# Patient Record
Sex: Female | Born: 1955
Health system: Southern US, Community
[De-identification: ages and names within clinical notes are randomized; demographics above are authoritative.]

## PROBLEM LIST (undated history)

## (undated) DIAGNOSIS — E785 Hyperlipidemia, unspecified: Secondary | ICD-10-CM

## (undated) DIAGNOSIS — F419 Anxiety disorder, unspecified: Secondary | ICD-10-CM

## (undated) DIAGNOSIS — J302 Other seasonal allergic rhinitis: Secondary | ICD-10-CM

## (undated) HISTORY — DX: Other seasonal allergic rhinitis: J30.2

## (undated) HISTORY — DX: Anxiety disorder, unspecified: F41.9

## (undated) HISTORY — DX: Hyperlipidemia, unspecified: E78.5

---

## 1998-11-23 ENCOUNTER — Ambulatory Visit (HOSPITAL_COMMUNITY): Admission: RE | Admit: 1998-11-23 | Discharge: 1998-11-23 | Payer: Self-pay | Admitting: Obstetrics and Gynecology

## 1998-11-23 ENCOUNTER — Encounter: Payer: Self-pay | Admitting: Obstetrics and Gynecology

## 2000-09-26 ENCOUNTER — Encounter: Payer: Self-pay | Admitting: Gynecology

## 2000-09-26 ENCOUNTER — Ambulatory Visit (HOSPITAL_COMMUNITY): Admission: RE | Admit: 2000-09-26 | Discharge: 2000-09-26 | Payer: Self-pay | Admitting: Gynecology

## 2001-10-15 ENCOUNTER — Encounter: Payer: Self-pay | Admitting: Gynecology

## 2001-10-15 ENCOUNTER — Ambulatory Visit (HOSPITAL_COMMUNITY): Admission: RE | Admit: 2001-10-15 | Discharge: 2001-10-15 | Payer: Self-pay | Admitting: Gynecology

## 2003-06-22 ENCOUNTER — Other Ambulatory Visit: Admission: RE | Admit: 2003-06-22 | Discharge: 2003-06-22 | Payer: Self-pay | Admitting: Gynecology

## 2003-07-03 ENCOUNTER — Ambulatory Visit (HOSPITAL_COMMUNITY): Admission: RE | Admit: 2003-07-03 | Discharge: 2003-07-03 | Payer: Self-pay | Admitting: Gynecology

## 2004-06-27 ENCOUNTER — Other Ambulatory Visit: Admission: RE | Admit: 2004-06-27 | Discharge: 2004-06-27 | Payer: Self-pay | Admitting: Gynecology

## 2004-08-05 ENCOUNTER — Ambulatory Visit (HOSPITAL_COMMUNITY): Admission: RE | Admit: 2004-08-05 | Discharge: 2004-08-05 | Payer: Self-pay | Admitting: Gynecology

## 2005-07-07 ENCOUNTER — Other Ambulatory Visit: Admission: RE | Admit: 2005-07-07 | Discharge: 2005-07-07 | Payer: Self-pay | Admitting: Gynecology

## 2005-08-07 ENCOUNTER — Ambulatory Visit (HOSPITAL_COMMUNITY): Admission: RE | Admit: 2005-08-07 | Discharge: 2005-08-07 | Payer: Self-pay | Admitting: Gynecology

## 2006-07-30 ENCOUNTER — Other Ambulatory Visit: Admission: RE | Admit: 2006-07-30 | Discharge: 2006-07-30 | Payer: Self-pay | Admitting: Gynecology

## 2006-10-24 ENCOUNTER — Ambulatory Visit (HOSPITAL_COMMUNITY): Admission: RE | Admit: 2006-10-24 | Discharge: 2006-10-24 | Payer: Self-pay | Admitting: Gynecology

## 2007-08-09 ENCOUNTER — Other Ambulatory Visit: Admission: RE | Admit: 2007-08-09 | Discharge: 2007-08-09 | Payer: Self-pay | Admitting: Gynecology

## 2007-10-29 ENCOUNTER — Ambulatory Visit: Payer: Self-pay | Admitting: Women's Health

## 2007-10-29 ENCOUNTER — Ambulatory Visit (HOSPITAL_COMMUNITY): Admission: RE | Admit: 2007-10-29 | Discharge: 2007-10-29 | Payer: Self-pay | Admitting: Gynecology

## 2007-12-17 ENCOUNTER — Ambulatory Visit: Payer: Self-pay | Admitting: Women's Health

## 2008-09-07 ENCOUNTER — Encounter: Payer: Self-pay | Admitting: Women's Health

## 2008-09-07 ENCOUNTER — Ambulatory Visit: Payer: Self-pay | Admitting: Women's Health

## 2008-09-07 ENCOUNTER — Other Ambulatory Visit: Admission: RE | Admit: 2008-09-07 | Discharge: 2008-09-07 | Payer: Self-pay | Admitting: Gynecology

## 2008-10-29 ENCOUNTER — Ambulatory Visit (HOSPITAL_COMMUNITY): Admission: RE | Admit: 2008-10-29 | Discharge: 2008-10-29 | Payer: Self-pay | Admitting: Gynecology

## 2009-09-08 ENCOUNTER — Ambulatory Visit: Payer: Self-pay | Admitting: Women's Health

## 2009-09-08 ENCOUNTER — Other Ambulatory Visit: Admission: RE | Admit: 2009-09-08 | Discharge: 2009-09-08 | Payer: Self-pay | Admitting: Gynecology

## 2009-11-02 ENCOUNTER — Ambulatory Visit (HOSPITAL_COMMUNITY): Admission: RE | Admit: 2009-11-02 | Discharge: 2009-11-02 | Payer: Self-pay | Admitting: Gynecology

## 2010-09-27 ENCOUNTER — Other Ambulatory Visit: Payer: Self-pay | Admitting: Women's Health

## 2010-09-27 DIAGNOSIS — Z1231 Encounter for screening mammogram for malignant neoplasm of breast: Secondary | ICD-10-CM

## 2010-10-18 DIAGNOSIS — K635 Polyp of colon: Secondary | ICD-10-CM | POA: Insufficient documentation

## 2010-10-18 DIAGNOSIS — F419 Anxiety disorder, unspecified: Secondary | ICD-10-CM | POA: Insufficient documentation

## 2010-10-18 DIAGNOSIS — J302 Other seasonal allergic rhinitis: Secondary | ICD-10-CM | POA: Insufficient documentation

## 2010-10-20 ENCOUNTER — Encounter: Payer: Self-pay | Admitting: Women's Health

## 2010-11-07 ENCOUNTER — Other Ambulatory Visit (HOSPITAL_COMMUNITY)
Admission: RE | Admit: 2010-11-07 | Discharge: 2010-11-07 | Disposition: A | Discharge status: Home / Self Care | Payer: PRIVATE HEALTH INSURANCE | Source: Ambulatory Visit | Attending: Women's Health | Admitting: Women's Health

## 2010-11-07 ENCOUNTER — Encounter: Payer: Self-pay | Admitting: Women's Health

## 2010-11-07 ENCOUNTER — Ambulatory Visit (INDEPENDENT_AMBULATORY_CARE_PROVIDER_SITE_OTHER): Payer: PRIVATE HEALTH INSURANCE | Admitting: Women's Health

## 2010-11-07 ENCOUNTER — Ambulatory Visit (HOSPITAL_COMMUNITY)
Admission: RE | Admit: 2010-11-07 | Discharge: 2010-11-07 | Disposition: A | Discharge status: Home / Self Care | Payer: PRIVATE HEALTH INSURANCE | Source: Ambulatory Visit | Attending: Women's Health | Admitting: Women's Health

## 2010-11-07 VITALS — BP 120/70 | Ht 63.0 in | Wt 188.0 lb

## 2010-11-07 DIAGNOSIS — Z01419 Encounter for gynecological examination (general) (routine) without abnormal findings: Secondary | ICD-10-CM | POA: Insufficient documentation

## 2010-11-07 DIAGNOSIS — R82998 Other abnormal findings in urine: Secondary | ICD-10-CM

## 2010-11-07 DIAGNOSIS — Z1231 Encounter for screening mammogram for malignant neoplasm of breast: Secondary | ICD-10-CM | POA: Insufficient documentation

## 2010-11-07 DIAGNOSIS — Z13220 Encounter for screening for lipoid disorders: Secondary | ICD-10-CM

## 2010-11-07 DIAGNOSIS — Z833 Family history of diabetes mellitus: Secondary | ICD-10-CM

## 2010-11-07 NOTE — Progress Notes (Signed)
Veronica Leonard Sep 30, 1955 [id number]    History:    The patient presents for annual exam.  Work and home life fine, Harrold Donath is now 32. Reviewed Gardasil for him, daughters have gotten.   Past medical history, past surgical history, family history and social history were all reviewed and documented in the EPIC chart.   ROS:  A  ROS was performed and pertinent positives and negatives are included in the history.  Exam:  Filed Vitals:   11/07/10 1017  BP: 120/70    General appearance:  Normal Head/Neck:  Normal, without cervical or supraclavicular adenopathy. Thyroid:  Symmetrical, normal in size, without palpable masses or nodularity. Respiratory  Effort:  Normal  Auscultation:  Clear without wheezing or rhonchi Cardiovascular  Auscultation:  Regular rate, without rubs, murmurs or gallops  Edema/varicosities:  Not grossly evident Abdominal  Soft,nontender, without masses, guarding or rebound.  Liver/spleen:  No organomegaly noted  Hernia:  None appreciated  Skin  Inspection:  Grossly normal  Palpation:  Grossly normal Neurologic/psychiatric  Orientation:  Normal with appropriate conversation.  Mood/affect:  Normal  Genitourinary    Breasts: Examined lying and sitting.     Right: Without masses, retractions, discharge or axillary adenopathy.     Left: Without masses, retractions, discharge or axillary adenopathy.   Inguinal/mons:  Normal without inguinal adenopathy  External genitalia:  Normal  BUS/Urethra/Skene's glands:  Normal  Bladder:  Normal  Vagina:  Normal  Cervix:  Normal  Uterus:  retroverted, normal in size, shape and contour.  Midline and mobile  Adnexa/parametria:     Rt: Without masses or tenderness.   Lt: Without masses or tenderness.  Anus and perineum: Normal  Digital rectal exam: Normal sphincter tone without palpated masses or tenderness  Assessment/Plan:  55 y.o. DWF G3P3 for annual exam monthly 6 day cycle/not sexually active. No complaints.  Menopause reviewed, and asymptomatic. Colonoscopy in 2009, negative polyp. Repeat in 2 years. Had a mammogram today.  Normal GYN exam  Plan: SBEs, continue annual mammogram, encouraged to increase exercise, decrease calories for weight loss. Reviewed to return to the office if no cycle for greater than 3 months,. Condoms encouraged if becomes sexually active. Vitamin D 2000 daily encouraged. CBC, glucose, lipid profile, UA and Pap.    Harrington Challenger [facility], 10:45 AM 11/07/2010

## 2010-11-08 ENCOUNTER — Other Ambulatory Visit: Payer: Self-pay | Admitting: Gynecology

## 2010-11-08 DIAGNOSIS — N390 Urinary tract infection, site not specified: Secondary | ICD-10-CM

## 2010-11-08 MED ORDER — SULFAMETHOXAZOLE-TRIMETHOPRIM 800-160 MG PO TABS: 1.0000 | ORAL_TABLET | Freq: Two times a day (BID) | ORAL | Status: AC

## 2010-11-08 NOTE — Progress Notes (Signed)
Pt informed with the below note. 

## 2010-11-08 NOTE — Progress Notes (Signed)
Urine culture positive for UTI we'll treat with Septra DS 1 by mouth twice a day x3 days

## 2010-11-28 ENCOUNTER — Ambulatory Visit (INDEPENDENT_AMBULATORY_CARE_PROVIDER_SITE_OTHER): Payer: PRIVATE HEALTH INSURANCE | Admitting: Women's Health

## 2010-11-28 DIAGNOSIS — R82998 Other abnormal findings in urine: Secondary | ICD-10-CM

## 2011-04-25 ENCOUNTER — Other Ambulatory Visit: Payer: Self-pay | Admitting: *Deleted

## 2011-04-25 DIAGNOSIS — E7800 Pure hypercholesterolemia, unspecified: Secondary | ICD-10-CM

## 2011-10-06 ENCOUNTER — Other Ambulatory Visit: Payer: Self-pay | Admitting: Women's Health

## 2011-10-06 DIAGNOSIS — Z1231 Encounter for screening mammogram for malignant neoplasm of breast: Secondary | ICD-10-CM

## 2011-11-09 ENCOUNTER — Ambulatory Visit (INDEPENDENT_AMBULATORY_CARE_PROVIDER_SITE_OTHER): Payer: PRIVATE HEALTH INSURANCE | Admitting: Women's Health

## 2011-11-09 ENCOUNTER — Encounter: Payer: Self-pay | Admitting: Women's Health

## 2011-11-09 ENCOUNTER — Ambulatory Visit (HOSPITAL_COMMUNITY)
Admission: RE | Admit: 2011-11-09 | Discharge: 2011-11-09 | Disposition: A | Discharge status: Home / Self Care | Payer: PRIVATE HEALTH INSURANCE | Source: Ambulatory Visit | Attending: Women's Health | Admitting: Women's Health

## 2011-11-09 VITALS — BP 134/88 | Ht 62.25 in | Wt 192.0 lb

## 2011-11-09 DIAGNOSIS — Z833 Family history of diabetes mellitus: Secondary | ICD-10-CM

## 2011-11-09 DIAGNOSIS — N912 Amenorrhea, unspecified: Secondary | ICD-10-CM

## 2011-11-09 DIAGNOSIS — Z13220 Encounter for screening for lipoid disorders: Secondary | ICD-10-CM

## 2011-11-09 DIAGNOSIS — K635 Polyp of colon: Secondary | ICD-10-CM

## 2011-11-09 DIAGNOSIS — D126 Benign neoplasm of colon, unspecified: Secondary | ICD-10-CM

## 2011-11-09 DIAGNOSIS — Z01419 Encounter for gynecological examination (general) (routine) without abnormal findings: Secondary | ICD-10-CM

## 2011-11-09 DIAGNOSIS — Z1231 Encounter for screening mammogram for malignant neoplasm of breast: Secondary | ICD-10-CM

## 2011-11-09 LAB — LIPID PANEL
Cholesterol: 268 mg/dL — ABNORMAL HIGH (ref 0–200)
LDL Cholesterol: 185 mg/dL — ABNORMAL HIGH (ref 0–99)
VLDL: 36 mg/dL (ref 0–40)

## 2011-11-09 LAB — GLUCOSE, RANDOM: Glucose, Bld: 91 mg/dL (ref 70–99)

## 2011-11-09 NOTE — Progress Notes (Signed)
Veronica Leonard [birth year] [id number]    History:    The patient presents for annual exam.  Had a regular monthly 5-6 day cycle until June. Colonoscopy 2009 negative polyp. History of normal Paps and mammograms. Partner history of prostate cancer limited sexual activity.   Past medical history, past surgical history, family history and social history were all reviewed and documented in the EPIC chart. Works for [employer]. Son Veronica Leonard 18, Veronica Leonard works at [employer] 36, has 4 sons. Father and brother diabetics, sister with hypertension.   ROS:  A  ROS was performed and pertinent positives and negatives are included in the history.  Exam:  Filed Vitals:   11/09/11 0815  BP: 134/88    General appearance:  Normal Head/Neck:  Normal, without cervical or supraclavicular adenopathy. Thyroid:  Symmetrical, normal in size, without palpable masses or nodularity. Respiratory  Effort:  Normal  Auscultation:  Clear without wheezing or rhonchi Cardiovascular  Auscultation:  Regular rate, without rubs, murmurs or gallops  Edema/varicosities:  Not grossly evident Abdominal  Soft,nontender, without masses, guarding or rebound.  Liver/spleen:  No organomegaly noted  Hernia:  None appreciated  Skin  Inspection:  Grossly normal  Palpation:  Grossly normal Neurologic/psychiatric  Orientation:  Normal with appropriate conversation.  Mood/affect:  Normal  Genitourinary    Breasts: Examined lying and sitting.     Right: Without masses, retractions, discharge or axillary adenopathy.     Left: Without masses, retractions, discharge or axillary adenopathy.   Inguinal/mons:  Normal without inguinal adenopathy  External genitalia:  Normal  BUS/Urethra/Skene's glands:  Normal  Bladder:  Normal  Vagina:  Normal  Cervix:  Normal  Uterus:   normal in size, shape and contour.  Midline and mobile  Adnexa/parametria:     Rt: Without masses or tenderness.   Lt: Without masses or tenderness.  Anus  and perineum: Normal  Digital rectal exam: Normal sphincter tone without palpated masses or tenderness  Assessment/Plan:  56 y.o. D. WF G3 P3 for annual exam with no complaints.  Amenorrheic x4 months Obesity Blood pressure 134/88/new onset. History of increased lipid panel on no medication  Plan: Instructed to recheck blood pressure away from office if continues greater than 130/80 instructed to followup with primary care/Dr. Izola Price. Reviewed importance of increasing regular exercise, decreasing calories for weight loss snd general health. SBE's, continue annual mammogram, calcium rich diet, vitamin D 2000 daily, fish oil supplement encouraged. HHC given,CBC, FSH, glucose, lipid panel, UA. No Pap, normal Pap 2012 new screening guidelines reviewed.    Harrington Challenger [facility], 9:01 AM 11/09/2011

## 2011-11-09 NOTE — Patient Instructions (Signed)

## 2011-11-10 LAB — CBC WITH DIFFERENTIAL/PLATELET
Basophils Absolute: 0 10*3/uL (ref 0.0–0.1)
Eosinophils Absolute: 0.1 10*3/uL (ref 0.0–0.7)
Eosinophils Relative: 2 % (ref 0–5)
Lymphocytes Relative: 16 % (ref 12–46)
MCHC: 33.3 g/dL (ref 30.0–36.0)
Monocytes Absolute: 0.6 10*3/uL (ref 0.1–1.0)
Neutrophils Relative %: 76 % (ref 43–77)

## 2011-11-10 LAB — URINALYSIS W MICROSCOPIC + REFLEX CULTURE
Glucose, UA: NEGATIVE mg/dL
Ketones, ur: NEGATIVE mg/dL
Nitrite: NEGATIVE
Specific Gravity, Urine: <1.005 — ABNORMAL LOW (ref 1.005–1.030)
Urobilinogen, UA: 0.2 mg/dL (ref 0.0–1.0)
pH: 6 (ref 5.0–8.0)

## 2011-11-12 LAB — URINE CULTURE: Colony Count: >=100000

## 2011-11-15 ENCOUNTER — Other Ambulatory Visit: Payer: Self-pay | Admitting: Women's Health

## 2011-11-15 DIAGNOSIS — N390 Urinary tract infection, site not specified: Secondary | ICD-10-CM

## 2011-11-15 MED ORDER — SULFAMETHOXAZOLE-TRIMETHOPRIM 800-160 MG PO TABS: 1.0000 | ORAL_TABLET | Freq: Two times a day (BID) | ORAL | Status: DC

## 2011-11-22 ENCOUNTER — Telehealth: Payer: Self-pay | Admitting: Women's Health

## 2011-11-22 DIAGNOSIS — B373 Candidiasis of vulva and vagina: Secondary | ICD-10-CM

## 2011-11-22 MED ORDER — FLUCONAZOLE 150 MG PO TABS: 150.0000 mg | ORAL_TABLET | Freq: Once | ORAL | Status: DC

## 2011-11-22 NOTE — Telephone Encounter (Signed)
TC - called with complaint of vaginal itching, completed antibiotic for UTI last week. Instructed to come to office if no relief with Diflucan.

## 2012-03-02 ENCOUNTER — Other Ambulatory Visit: Payer: Self-pay

## 2012-10-11 ENCOUNTER — Other Ambulatory Visit: Payer: Self-pay | Admitting: Women's Health

## 2012-10-11 DIAGNOSIS — Z1231 Encounter for screening mammogram for malignant neoplasm of breast: Secondary | ICD-10-CM

## 2012-11-13 ENCOUNTER — Ambulatory Visit (HOSPITAL_COMMUNITY)
Admission: RE | Admit: 2012-11-13 | Discharge: 2012-11-13 | Disposition: A | Discharge status: Home / Self Care | Payer: PRIVATE HEALTH INSURANCE | Source: Ambulatory Visit | Attending: Women's Health | Admitting: Women's Health

## 2012-11-13 ENCOUNTER — Ambulatory Visit (HOSPITAL_COMMUNITY): Payer: PRIVATE HEALTH INSURANCE

## 2012-11-13 DIAGNOSIS — Z124 Encounter for screening for malignant neoplasm of cervix: Secondary | ICD-10-CM | POA: Insufficient documentation

## 2012-11-13 DIAGNOSIS — Z1231 Encounter for screening mammogram for malignant neoplasm of breast: Secondary | ICD-10-CM

## 2012-11-14 ENCOUNTER — Encounter: Payer: Self-pay | Admitting: Women's Health

## 2012-11-14 ENCOUNTER — Ambulatory Visit (INDEPENDENT_AMBULATORY_CARE_PROVIDER_SITE_OTHER): Payer: PRIVATE HEALTH INSURANCE | Admitting: Women's Health

## 2012-11-14 ENCOUNTER — Other Ambulatory Visit (HOSPITAL_COMMUNITY)
Admission: RE | Admit: 2012-11-14 | Discharge: 2012-11-14 | Disposition: A | Discharge status: Home / Self Care | Payer: PRIVATE HEALTH INSURANCE | Source: Ambulatory Visit | Attending: Gynecology | Admitting: Gynecology

## 2012-11-14 VITALS — BP 128/88 | Ht 62.25 in | Wt 192.0 lb

## 2012-11-14 DIAGNOSIS — Z833 Family history of diabetes mellitus: Secondary | ICD-10-CM

## 2012-11-14 DIAGNOSIS — N912 Amenorrhea, unspecified: Secondary | ICD-10-CM

## 2012-11-14 DIAGNOSIS — Z01419 Encounter for gynecological examination (general) (routine) without abnormal findings: Secondary | ICD-10-CM | POA: Insufficient documentation

## 2012-11-14 DIAGNOSIS — Z23 Encounter for immunization: Secondary | ICD-10-CM

## 2012-11-14 DIAGNOSIS — Z13220 Encounter for screening for lipoid disorders: Secondary | ICD-10-CM

## 2012-11-14 LAB — CBC WITH DIFFERENTIAL/PLATELET
Basophils Absolute: 0.1 10*3/uL (ref 0.0–0.1)
Basophils Relative: 1 % (ref 0–1)
Monocytes Absolute: 0.5 10*3/uL (ref 0.1–1.0)
Monocytes Relative: 6 % (ref 3–12)
Neutrophils Relative %: 70 % (ref 43–77)
RBC: 4.66 MIL/uL (ref 3.87–5.11)
WBC: 7.9 10*3/uL (ref 4.0–10.5)

## 2012-11-14 LAB — LIPID PANEL
LDL Cholesterol: 185 mg/dL — ABNORMAL HIGH (ref 0–99)
Total CHOL/HDL Ratio: 5.5 Ratio

## 2012-11-14 LAB — FOLLICLE STIMULATING HORMONE: FSH: 86.3 m[IU]/mL

## 2012-11-14 NOTE — Progress Notes (Signed)
Veronica Leonard [birth year] [id number]    History:    The patient presents for annual exam.  Regular monthly cycle until March, cycle in July amenorrheic since with occasional hot flushes. Normal Pap and mammogram history. Situational anxiety in the past. Benign colon polyps 2009.  Past medical history, past surgical history, family history and social history were all reviewed and documented in the EPIC chart. Remarried this year, rare intercourse partner prostate cancer. Father diabetes. Harrold Donath 18 at [employer] in [employer]. Works for [employer].  ROS:  A  ROS was performed and pertinent positives and negatives are included in the history.  Exam:  Filed Vitals:   11/14/12 0800  BP: 128/88    General appearance:  Normal Head/Neck:  Normal, without cervical or supraclavicular adenopathy. Thyroid:  Symmetrical, normal in size, without palpable masses or nodularity. Respiratory  Effort:  Normal  Auscultation:  Clear without wheezing or rhonchi Cardiovascular  Auscultation:  Regular rate, without rubs, murmurs or gallops  Edema/varicosities:  Not grossly evident Abdominal  Soft,nontender, without masses, guarding or rebound.  Liver/spleen:  No organomegaly noted  Hernia:  None appreciated  Skin  Inspection:  Grossly normal  Palpation:  Grossly normal Neurologic/psychiatric  Orientation:  Normal with appropriate conversation.  Mood/affect:  Normal  Genitourinary    Breasts: Examined lying and sitting.     Right: Without masses, retractions, discharge or axillary adenopathy.     Left: Without masses, retractions, discharge or axillary adenopathy.   Inguinal/mons:  Normal without inguinal adenopathy  External genitalia:  Normal  BUS/Urethra/Skene's glands:  Normal  Bladder:  Normal  Vagina:  Normal  Cervix:  Normal  Uterus:  normal in size, shape and contour.  Midline and mobile  Adnexa/parametria:     Rt: Without masses or tenderness.   Lt: Without masses or  tenderness.  Anus and perineum: Normal  Digital rectal exam: Normal sphincter tone without palpated masses or tenderness  Assessment/Plan:  57 y.o. MWF G3P3 for annual exam with no complaints.  Perimenopausal Obesity  Plan: SBE's, continue annual mammogram, 3-D tomography reviewed and encouraged history of dense breast. Increase regular exercise, decrease calories for weight loss, decrease saturated fat, fish oil supplement and vitamin D 1000 daily encouraged. CBC, lipid panel, glucose, UA, FSH, Pap. Pap normal 2012, new screening guidelines reviewed. Repeat colonoscopy next year, history of benign colon polyp. Blood pressure 128/88, will check away from office if continues greater than 130/80 followup with primary care.   Harrington Challenger [facility], 8:41 AM 11/14/2012

## 2012-11-14 NOTE — Addendum Note (Signed)
Addended by: Richardson Chiquito on: 11/14/2012 05:04 PM   Modules accepted: Orders

## 2012-11-14 NOTE — Patient Instructions (Signed)
Health Recommendations for Postmenopausal Women Respected and ongoing research has looked at the most common causes of death, disability, and poor quality of life in postmenopausal women. The causes include heart disease, diseases of blood vessels, diabetes, depression, cancer, and bone loss (osteoporosis). Many things can be done to help lower the chances of developing these and other common problems: CARDIOVASCULAR DISEASE Heart Disease: A heart attack is a medical emergency. Know the signs and symptoms of a heart attack. Below are things women can do to reduce their risk for heart disease.   Do not smoke. If you smoke, quit.  Aim for a healthy weight. Being overweight causes many preventable deaths. Eat a healthy and balanced diet and drink an adequate amount of liquids.  Get moving. Make a commitment to be more physically active. Aim for 30 minutes of activity on most, if not all days of the week.  Eat for heart health. Choose a diet that is low in saturated fat and cholesterol and eliminate trans fat. Include whole grains, vegetables, and fruits. Read and understand the labels on food containers before buying.  Know your numbers. Ask your caregiver to check your blood pressure, cholesterol (total, HDL, LDL, triglycerides) and blood glucose. Work with your caregiver on improving your entire clinical picture.  High blood pressure. Limit or stop your table salt intake (try salt substitute and food seasonings). Avoid salty foods and drinks. Read labels on food containers before buying. Eating well and exercising can help control high blood pressure. STROKE  Stroke is a medical emergency. Stroke may be the result of a blood clot in a blood vessel in the brain or by a brain hemorrhage (bleeding). Know the signs and symptoms of a stroke. To lower the risk of developing a stroke:  Avoid fatty foods.  Quit smoking.  Control your diabetes, blood pressure, and irregular heart rate. THROMBOPHLEBITIS  (BLOOD CLOT) OF THE LEG  Becoming overweight and leading a stationary lifestyle may also contribute to developing blood clots. Controlling your diet and exercising will help lower the risk of developing blood clots. CANCER SCREENING  Breast Cancer: Take steps to reduce your risk of breast cancer.  You should practice "breast self-awareness." This means understanding the normal appearance and feel of your breasts and should include breast self-examination. Any changes detected, no matter how small, should be reported to your caregiver.  After age 40, you should have a clinical breast exam (CBE) every year.  Starting at age 40, you should consider having a mammogram (breast X-ray) every year.  If you have a family history of breast cancer, talk to your caregiver about genetic screening.  If you are at high risk for breast cancer, talk to your caregiver about having an MRI and a mammogram every year.  Intestinal or Stomach Cancer: Tests to consider are a rectal exam, fecal occult blood, sigmoidoscopy, and colonoscopy. Women who are high risk may need to be screened at an earlier age and more often.  Cervical Cancer:  Beginning at age 30, you should have a Pap test every 3 years as long as the past 3 Pap tests have been normal.  If you have had past treatment for cervical cancer or a condition that could lead to cancer, you need Pap tests and screening for cancer for at least 20 years after your treatment.  If you had a hysterectomy for a problem that was not cancer or a condition that could lead to cancer, then you no longer need Pap tests.    If you are between ages 65 and 70, and you have had normal Pap tests going back 10 years, you no longer need Pap tests.  If Pap tests have been discontinued, risk factors (such as a new sexual partner) need to be reassessed to determine if screening should be resumed.  Some medical problems can increase the chance of getting cervical cancer. In these  cases, your caregiver may recommend more frequent screening and Pap tests.  Uterine Cancer: If you have vaginal bleeding after reaching menopause, you should notify your caregiver.  Ovarian cancer: Other than yearly pelvic exams, there are no reliable tests available to screen for ovarian cancer at this time except for yearly pelvic exams.  Lung Cancer: Yearly chest X-rays can detect lung cancer and should be done on high risk women, such as cigarette smokers and women with chronic lung disease (emphysema).  Skin Cancer: A complete body skin exam should be done at your yearly examination. Avoid overexposure to the sun and ultraviolet light lamps. Use a strong sun block cream when in the sun. All of these things are important in lowering the risk of skin cancer. MENOPAUSE Menopause Symptoms: Hormone therapy products are effective for treating symptoms associated with menopause:  Moderate to severe hot flashes.  Night sweats.  Mood swings.  Headaches.  Tiredness.  Loss of sex drive.  Insomnia.  Other symptoms. Hormone replacement carries certain risks, especially in older women. Women who use or are thinking about using estrogen or estrogen with progestin treatments should discuss that with their caregiver. Your caregiver will help you understand the benefits and risks. The ideal dose of hormone replacement therapy is not known. The Food and Drug Administration (FDA) has concluded that hormone therapy should be used only at the lowest doses and for the shortest amount of time to reach treatment goals.  OSTEOPOROSIS Protecting Against Bone Loss and Preventing Fracture: If you use hormone therapy for prevention of bone loss (osteoporosis), the risks for bone loss must outweigh the risk of the therapy. Ask your caregiver about other medications known to be safe and effective for preventing bone loss and fractures. To guard against bone loss or fractures, the following is recommended:  If  you are less than age 50, take 1000 mg of calcium and at least 600 mg of Vitamin D per day.  If you are greater than age 50 but less than age 70, take 1200 mg of calcium and at least 600 mg of Vitamin D per day.  If you are greater than age 70, take 1200 mg of calcium and at least 800 mg of Vitamin D per day. Smoking and excessive alcohol intake increases the risk of osteoporosis. Eat foods rich in calcium and vitamin D and do weight bearing exercises several times a week as your caregiver suggests. DIABETES Diabetes Melitus: If you have Type I or Type 2 diabetes, you should keep your blood sugar under control with diet, exercise and recommended medication. Avoid too many sweets, starchy and fatty foods. Being overweight can make control more difficult. COGNITION AND MEMORY Cognition and Memory: Menopausal hormone therapy is not recommended for the prevention of cognitive disorders such as Alzheimer's disease or memory loss.  DEPRESSION  Depression may occur at any age, but is common in elderly women. The reasons may be because of physical, medical, social (loneliness), or financial problems and needs. If you are experiencing depression because of medical problems and control of symptoms, talk to your caregiver about this. Physical activity and   exercise may help with mood and sleep. Community and volunteer involvement may help your sense of value and worth. If you have depression and you feel that the problem is getting worse or becoming severe, talk to your caregiver about treatment options that are best for you. ACCIDENTS  Accidents are common and can be serious in the elderly woman. Prepare your house to prevent accidents. Eliminate throw rugs, place hand bars in the bath, shower and toilet areas. Avoid wearing high heeled shoes or walking on wet, snowy, and icy areas. Limit or stop driving if you have vision or hearing problems, or you feel you are unsteady with you movements and  reflexes. HEPATITIS C Hepatitis C is a type of viral infection affecting the liver. It is spread mainly through contact with blood from an infected person. It can be treated, but if left untreated, it can lead to severe liver damage over years. Many people who are infected do not know that the virus is in their blood. If you are a "baby-boomer", it is recommended that you have one screening test for Hepatitis C. IMMUNIZATIONS  Several immunizations are important to consider having during your senior years, including:   Tetanus, diptheria, and pertussis booster shot.  Influenza every year before the flu season begins.  Pneumonia vaccine.  Shingles vaccine.  Others as indicated based on your specific needs. Talk to your caregiver about these. Document Released: 02/24/2005 Document Revised: 12/20/2011 Document Reviewed: 10/21/2007 Exit[employer] Patient Information 2014 [employer].  

## 2013-11-10 ENCOUNTER — Other Ambulatory Visit: Payer: Self-pay | Admitting: Women's Health

## 2013-11-10 DIAGNOSIS — Z1231 Encounter for screening mammogram for malignant neoplasm of breast: Secondary | ICD-10-CM

## 2013-11-17 ENCOUNTER — Encounter: Payer: Self-pay | Admitting: Women's Health

## 2013-11-21 ENCOUNTER — Ambulatory Visit (INDEPENDENT_AMBULATORY_CARE_PROVIDER_SITE_OTHER): Payer: 59 | Admitting: Women's Health

## 2013-11-21 ENCOUNTER — Encounter: Payer: Self-pay | Admitting: Women's Health

## 2013-11-21 ENCOUNTER — Ambulatory Visit (HOSPITAL_COMMUNITY)
Admission: RE | Admit: 2013-11-21 | Discharge: 2013-11-21 | Disposition: A | Discharge status: Home / Self Care | Payer: Managed Care, Other (non HMO) | Source: Ambulatory Visit | Attending: Women's Health | Admitting: Women's Health

## 2013-11-21 VITALS — BP 132/84 | Ht 63.0 in | Wt 199.0 lb

## 2013-11-21 DIAGNOSIS — Z01419 Encounter for gynecological examination (general) (routine) without abnormal findings: Secondary | ICD-10-CM

## 2013-11-21 DIAGNOSIS — N926 Irregular menstruation, unspecified: Secondary | ICD-10-CM

## 2013-11-21 DIAGNOSIS — Z1231 Encounter for screening mammogram for malignant neoplasm of breast: Secondary | ICD-10-CM | POA: Insufficient documentation

## 2013-11-21 DIAGNOSIS — Z13220 Encounter for screening for lipoid disorders: Secondary | ICD-10-CM

## 2013-11-21 LAB — CBC WITH DIFFERENTIAL/PLATELET
BASOS ABS: 0 10*3/uL (ref 0.0–0.1)
BASOS PCT: 0 % (ref 0–1)
EOS ABS: 0.2 10*3/uL (ref 0.0–0.7)
EOS PCT: 3 % (ref 0–5)
HCT: 44.2 % (ref 36.0–46.0)
Hemoglobin: 14.1 g/dL (ref 12.0–15.0)
Lymphocytes Relative: 20 % (ref 12–46)
Lymphs Abs: 1.5 10*3/uL (ref 0.7–4.0)
MCH: 28.8 pg (ref 26.0–34.0)
MCHC: 31.9 g/dL (ref 30.0–36.0)
MCV: 90.4 fL (ref 78.0–100.0)
Monocytes Absolute: 0.4 10*3/uL (ref 0.1–1.0)
Monocytes Relative: 6 % (ref 3–12)
Neutro Abs: 5.3 10*3/uL (ref 1.7–7.7)
Neutrophils Relative %: 71 % (ref 43–77)
PLATELETS: 251 10*3/uL (ref 150–400)
RBC: 4.89 MIL/uL (ref 3.87–5.11)
RDW: 13.2 % (ref 11.5–15.5)
WBC: 7.4 10*3/uL (ref 4.0–10.5)

## 2013-11-21 NOTE — Patient Instructions (Signed)

## 2013-11-21 NOTE — Progress Notes (Signed)
Veronica Leonard 11/04/1955 [id number]    History:    Presents for annual exam.  Normal Pap and mammogram history. Had been having periods of amenorrhea [place] 56 2014, 10 2013. Past year states feels that she is cycling again with PMS type symptoms of breast tenderness, ovulation-type discharge and has had several episodes of 1-2 days of light red bleeding. Reports her mother had menopause late in life. 2009 benign colon polyp has follow-up scheduled.  Past medical history, past surgical history, family history and social history were all reviewed and documented in the EPIC chart. works for [employer].  Husband prostate cancer erectile dysfunction. Father diabetes.  ROS:  A  12 point ROS was performed and pertinent positives and negatives are included.  Exam:  Filed Vitals:   11/21/13 0824  BP: 132/84    General appearance:  Normal Thyroid:  Symmetrical, normal in size, without palpable masses or nodularity. Respiratory  Auscultation:  Clear without wheezing or rhonchi Cardiovascular  Auscultation:  Regular rate, without rubs, murmurs or gallops  Edema/varicosities:  Not grossly evident Abdominal  Soft,nontender, without masses, guarding or rebound.  Liver/spleen:  No organomegaly noted  Hernia:  None appreciated  Skin  Inspection:  Grossly normal   Breasts: Examined lying and sitting.     Right: Without masses, retractions, discharge or axillary adenopathy.     Left: Without masses, retractions, discharge or axillary adenopathy. Gentitourinary   Inguinal/mons:  Normal without inguinal adenopathy  External genitalia:  Normal  BUS/Urethra/Skene's glands:  Normal  Vagina:  Normal  Cervix:  Normal  Uterus:  normal in size, shape and contour.  Midline and mobile  Adnexa/parametria:     Rt: Without masses or tenderness.   Lt: Without masses or tenderness.  Anus and perineum: Normal  Digital rectal exam: Normal sphincter tone without palpated masses or  tenderness  Assessment/Plan:  58 y.o. MWF G3P3 for annual exam.     Perimenopausal Mild anxiety/depression-no medication  Plan: [place] pending, if menopausal range, sonohysterogram with biopsy with Dr. Phineas Real. Reviewed importance of follow-up. SBE's, continue annual 3-D mammogram, calcium rich diet, vitamin D 2000 daily encouraged. Increase regular exercise and decrease calories for weight loss. CBC, conference metabolic panel, lipid panel, UA, Pap normal 2014, new screening guidelines reviewed.    [place], 8:56 AM 11/21/2013

## 2013-11-22 LAB — COMPREHENSIVE METABOLIC PANEL
ALK PHOS: 63 U/L (ref 39–117)
ALT: 17 U/L (ref 0–35)
AST: 16 U/L (ref 0–37)
Albumin: 4.2 g/dL (ref 3.5–5.2)
BILIRUBIN TOTAL: 0.3 mg/dL (ref 0.2–1.2)
BUN: 8 mg/dL (ref 6–23)
CO2: 27 mEq/L (ref 19–32)
CREATININE: 0.62 mg/dL (ref 0.50–1.10)
Calcium: 9.3 mg/dL (ref 8.4–10.5)
Chloride: 98 mEq/L (ref 96–112)
Glucose, Bld: 95 mg/dL (ref 70–99)
Potassium: 4.5 mEq/L (ref 3.5–5.3)
Sodium: 137 mEq/L (ref 135–145)
Total Protein: 6.8 g/dL (ref 6.0–8.3)

## 2013-11-22 LAB — LIPID PANEL
CHOL/HDL RATIO: 5.9 ratio
Cholesterol: 270 mg/dL — ABNORMAL HIGH (ref 0–200)
HDL: 46 mg/dL (ref >39)
LDL Cholesterol: 180 mg/dL — ABNORMAL HIGH (ref 0–99)
Triglycerides: 221 mg/dL — ABNORMAL HIGH (ref <150)
VLDL: 44 mg/dL — AB (ref 0–40)

## 2013-11-22 LAB — TSH: TSH: 1.821 u[IU]/mL (ref 0.350–4.500)

## 2013-11-22 LAB — FOLLICLE STIMULATING HORMONE: FSH: 31.3 m[IU]/mL

## 2013-11-24 ENCOUNTER — Other Ambulatory Visit: Payer: Self-pay | Admitting: Gynecology

## 2013-11-24 ENCOUNTER — Encounter: Payer: Self-pay | Admitting: Women's Health

## 2013-11-24 DIAGNOSIS — N950 Postmenopausal bleeding: Secondary | ICD-10-CM

## 2014-10-27 ENCOUNTER — Other Ambulatory Visit: Payer: Self-pay

## 2014-10-27 DIAGNOSIS — Z1231 Encounter for screening mammogram for malignant neoplasm of breast: Secondary | ICD-10-CM

## 2014-11-26 ENCOUNTER — Ambulatory Visit
Admission: RE | Admit: 2014-11-26 | Discharge: 2014-11-26 | Disposition: A | Discharge status: Home / Self Care | Payer: 59 | Source: Ambulatory Visit

## 2014-11-26 ENCOUNTER — Encounter: Payer: Self-pay | Admitting: Women's Health

## 2014-11-26 ENCOUNTER — Other Ambulatory Visit (HOSPITAL_COMMUNITY)
Admission: RE | Admit: 2014-11-26 | Discharge: 2014-11-26 | Disposition: A | Discharge status: Home / Self Care | Payer: 59 | Source: Ambulatory Visit | Attending: Women's Health | Admitting: Women's Health

## 2014-11-26 ENCOUNTER — Ambulatory Visit (INDEPENDENT_AMBULATORY_CARE_PROVIDER_SITE_OTHER): Payer: 59 | Admitting: Women's Health

## 2014-11-26 VITALS — BP 128/80 | Ht 63.0 in | Wt 199.0 lb

## 2014-11-26 DIAGNOSIS — Z13220 Encounter for screening for lipoid disorders: Secondary | ICD-10-CM

## 2014-11-26 DIAGNOSIS — Z1151 Encounter for screening for human papillomavirus (HPV): Secondary | ICD-10-CM | POA: Insufficient documentation

## 2014-11-26 DIAGNOSIS — Z01419 Encounter for gynecological examination (general) (routine) without abnormal findings: Secondary | ICD-10-CM | POA: Insufficient documentation

## 2014-11-26 DIAGNOSIS — Z1231 Encounter for screening mammogram for malignant neoplasm of breast: Secondary | ICD-10-CM

## 2014-11-26 DIAGNOSIS — Z1321 Encounter for screening for nutritional disorder: Secondary | ICD-10-CM

## 2014-11-26 DIAGNOSIS — Z1329 Encounter for screening for other suspected endocrine disorder: Secondary | ICD-10-CM | POA: Diagnosis not present

## 2014-11-26 LAB — LIPID PANEL
Cholesterol: 282 mg/dL — ABNORMAL HIGH (ref 125–200)
HDL: 41 mg/dL — ABNORMAL LOW (ref >=46)
LDL CALC: 192 mg/dL — AB (ref <130)
TRIGLYCERIDES: 244 mg/dL — AB (ref <150)
Total CHOL/HDL Ratio: 6.9 Ratio — ABNORMAL HIGH (ref <=5.0)
VLDL: 49 mg/dL — AB (ref <30)

## 2014-11-26 LAB — COMPREHENSIVE METABOLIC PANEL
ALK PHOS: 72 U/L (ref 33–130)
ALT: 17 U/L (ref 6–29)
AST: 19 U/L (ref 10–35)
Albumin: 4.2 g/dL (ref 3.6–5.1)
BILIRUBIN TOTAL: 0.3 mg/dL (ref 0.2–1.2)
BUN: 11 mg/dL (ref 7–25)
CALCIUM: 9.7 mg/dL (ref 8.6–10.4)
CO2: 29 mmol/L (ref 20–31)
Chloride: 101 mmol/L (ref 98–110)
Creat: 0.63 mg/dL (ref 0.50–1.05)
Glucose, Bld: 96 mg/dL (ref 65–99)
POTASSIUM: 5.3 mmol/L (ref 3.5–5.3)
Sodium: 139 mmol/L (ref 135–146)
TOTAL PROTEIN: 6.9 g/dL (ref 6.1–8.1)

## 2014-11-26 LAB — TSH: TSH: 1.827 u[IU]/mL (ref 0.350–4.500)

## 2014-11-26 NOTE — Addendum Note (Signed)
Addended by: Burnett Kanaris on: 11/26/2014 08:47 AM   Modules accepted: Orders

## 2014-11-26 NOTE — Progress Notes (Signed)
Veronica Leonard 05/20/1955 [id number]    History:    Presents for annual exam.  Irregular periods last 2 years, last cycle January 2016. [place]. 2009 benign colon polyp. Normal Pap and mammogram history.  Past medical history, past surgical history, family history and social history were all reviewed and documented in the EPIC chart. Works for [employer]. 3 children all doing well. Father diabetes. Husband prostate cancer/ED.  ROS:  A ROS was performed and pertinent positives and negatives are included.  Exam:  Filed Vitals:   11/26/14 0800  BP: 128/80    General appearance:  Normal Thyroid:  Symmetrical, normal in size, without palpable masses or nodularity. Respiratory  Auscultation:  Clear without wheezing or rhonchi Cardiovascular  Auscultation:  Regular rate, without rubs, murmurs or gallops  Edema/varicosities:  Not grossly evident Abdominal  Soft,nontender, without masses, guarding or rebound.  Liver/spleen:  No organomegaly noted  Hernia:  None appreciated  Skin  Inspection:  Grossly normal   Breasts: Examined lying and sitting.     Right: Without masses, retractions, discharge or axillary adenopathy.     Left: Without masses, retractions, discharge or axillary adenopathy. Gentitourinary   Inguinal/mons:  Normal without inguinal adenopathy  External genitalia:  Normal  BUS/Urethra/Skene's glands:  Normal  Vagina:  Normal  Cervix:  Normal  Uterus:   normal in size, shape and contour.  Midline and mobile  Adnexa/parametria:     Rt: Without masses or tenderness.   Lt: Without masses or tenderness.  Anus and perineum: Normal  Digital rectal exam: Normal sphincter tone without palpated masses or tenderness  Assessment/Plan:  59 y.o. MWF G3 P3 for annual exam with no complaints.  Perimenopausal/LMP 01/2014-elevated [place] Rare sexual activity-husbands health Obesity  Plan: Menopause reviewed, occasional hot flushes, reports difficulty losing weight  biggest problem. Weight stable. Instructed to call if any further bleeding. SBE's, continue annual screening 3-D mammogram, calcium rich diet, vitamin D 1000 daily encouraged. Reviewed importance of increasing exercise and decreasing calories/carbs for weight loss. CBC, CMP, lipid panel, TSH, vitamin D, UA, Pap with HR HPV typing, new screening guidelines reviewed.    [place], 8:40 AM 11/26/2014

## 2014-11-26 NOTE — Patient Instructions (Signed)

## 2014-11-26 NOTE — Addendum Note (Signed)
Addended by: Huel Cote on: 11/26/2014 09:59 AM   Modules accepted: Orders

## 2014-11-27 LAB — CBC WITH DIFFERENTIAL/PLATELET
BASOS ABS: 0.1 10*3/uL (ref 0.0–0.1)
Basophils Relative: 1 % (ref 0–1)
Eosinophils Absolute: 0.2 10*3/uL (ref 0.0–0.7)
Eosinophils Relative: 3 % (ref 0–5)
HEMATOCRIT: 42.1 % (ref 36.0–46.0)
Hemoglobin: 13.4 g/dL (ref 12.0–15.0)
LYMPHS ABS: 1.5 10*3/uL (ref 0.7–4.0)
LYMPHS PCT: 20 % (ref 12–46)
MCH: 28.1 pg (ref 26.0–34.0)
MCHC: 31.8 g/dL (ref 30.0–36.0)
MCV: 88.3 fL (ref 78.0–100.0)
MPV: 10.2 fL (ref 8.6–12.4)
Monocytes Absolute: 0.5 10*3/uL (ref 0.1–1.0)
Monocytes Relative: 6 % (ref 3–12)
NEUTROS ABS: 5.3 10*3/uL (ref 1.7–7.7)
NEUTROS PCT: 70 % (ref 43–77)
Platelets: 272 10*3/uL (ref 150–400)
RBC: 4.77 MIL/uL (ref 3.87–5.11)
RDW: 14 % (ref 11.5–15.5)
WBC: 7.6 10*3/uL (ref 4.0–10.5)

## 2014-11-27 LAB — VITAMIN D 25 HYDROXY (VIT D DEFICIENCY, FRACTURES): VIT D 25 HYDROXY: 20 ng/mL — AB (ref 30–100)

## 2014-11-27 LAB — CYTOLOGY - PAP

## 2014-11-27 LAB — URINALYSIS W MICROSCOPIC + REFLEX CULTURE
BACTERIA UA: NONE SEEN [HPF]
BILIRUBIN URINE: NEGATIVE
CRYSTALS: NONE SEEN [HPF]
Casts: NONE SEEN [LPF]
GLUCOSE, UA: NEGATIVE
HGB URINE DIPSTICK: NEGATIVE
KETONES UR: NEGATIVE
Leukocytes, UA: NEGATIVE
NITRITE: NEGATIVE
PROTEIN: NEGATIVE
RBC / HPF: NONE SEEN RBC/HPF (ref <=2)
SQUAMOUS EPITHELIAL / LPF: NONE SEEN [HPF] (ref <=5)
Specific Gravity, Urine: 1.009 (ref 1.001–1.035)
Yeast: NONE SEEN [HPF]
pH: 6.5 (ref 5.0–8.0)

## 2014-11-29 ENCOUNTER — Encounter: Payer: Self-pay | Admitting: Women's Health

## 2014-11-29 LAB — URINE CULTURE

## 2014-12-03 ENCOUNTER — Other Ambulatory Visit: Payer: Self-pay | Admitting: Women's Health

## 2014-12-03 DIAGNOSIS — E559 Vitamin D deficiency, unspecified: Secondary | ICD-10-CM

## 2014-12-03 MED ORDER — CIPROFLOXACIN HCL 250 MG PO TABS: 250.0000 mg | ORAL_TABLET | Freq: Two times a day (BID) | ORAL | Status: DC

## 2014-12-03 MED ORDER — VITAMIN D (ERGOCALCIFEROL) 1.25 MG (50000 UNIT) PO CAPS: 50000.0000 [IU] | ORAL_CAPSULE | ORAL | Status: DC

## 2016-02-08 ENCOUNTER — Other Ambulatory Visit: Payer: Self-pay | Admitting: Gynecology

## 2016-02-08 DIAGNOSIS — Z1231 Encounter for screening mammogram for malignant neoplasm of breast: Secondary | ICD-10-CM

## 2016-03-09 ENCOUNTER — Ambulatory Visit
Admission: RE | Admit: 2016-03-09 | Discharge: 2016-03-09 | Disposition: A | Discharge status: Home / Self Care | Payer: BLUE CROSS/BLUE SHIELD | Source: Ambulatory Visit | Attending: Gynecology | Admitting: Gynecology

## 2016-03-09 ENCOUNTER — Encounter: Payer: Self-pay | Admitting: Radiology

## 2016-03-09 DIAGNOSIS — Z1231 Encounter for screening mammogram for malignant neoplasm of breast: Secondary | ICD-10-CM

## 2016-03-10 ENCOUNTER — Encounter: Payer: Self-pay | Admitting: Women's Health

## 2016-03-10 ENCOUNTER — Other Ambulatory Visit: Payer: Self-pay | Admitting: Women's Health

## 2016-03-10 ENCOUNTER — Ambulatory Visit (INDEPENDENT_AMBULATORY_CARE_PROVIDER_SITE_OTHER): Payer: BLUE CROSS/BLUE SHIELD | Admitting: Women's Health

## 2016-03-10 ENCOUNTER — Telehealth: Payer: Self-pay

## 2016-03-10 VITALS — BP 128/86 | Ht 62.25 in | Wt 194.0 lb

## 2016-03-10 DIAGNOSIS — E559 Vitamin D deficiency, unspecified: Secondary | ICD-10-CM | POA: Diagnosis not present

## 2016-03-10 DIAGNOSIS — Z23 Encounter for immunization: Secondary | ICD-10-CM

## 2016-03-10 DIAGNOSIS — B009 Herpesviral infection, unspecified: Secondary | ICD-10-CM

## 2016-03-10 DIAGNOSIS — Z01419 Encounter for gynecological examination (general) (routine) without abnormal findings: Secondary | ICD-10-CM

## 2016-03-10 LAB — COMPREHENSIVE METABOLIC PANEL
ALBUMIN: 4.3 g/dL (ref 3.6–5.1)
ALK PHOS: 64 U/L (ref 33–130)
ALT: 18 U/L (ref 6–29)
AST: 21 U/L (ref 10–35)
BILIRUBIN TOTAL: 0.6 mg/dL (ref 0.2–1.2)
BUN: 12 mg/dL (ref 7–25)
CALCIUM: 9.6 mg/dL (ref 8.6–10.4)
CO2: 26 mmol/L (ref 20–31)
Chloride: 99 mmol/L (ref 98–110)
Creat: 0.84 mg/dL (ref 0.50–1.05)
GLUCOSE: 105 mg/dL — AB (ref 65–99)
Potassium: 4.8 mmol/L (ref 3.5–5.3)
Sodium: 137 mmol/L (ref 135–146)
Total Protein: 7.3 g/dL (ref 6.1–8.1)

## 2016-03-10 LAB — CBC WITH DIFFERENTIAL/PLATELET
BASOS PCT: 1 %
Basophils Absolute: 76 cells/uL (ref 0–200)
Eosinophils Absolute: 304 cells/uL (ref 15–500)
Eosinophils Relative: 4 %
HEMATOCRIT: 42.9 % (ref 35.0–45.0)
HEMOGLOBIN: 14 g/dL (ref 11.7–15.5)
LYMPHS ABS: 1672 {cells}/uL (ref 850–3900)
Lymphocytes Relative: 22 %
MCH: 28.8 pg (ref 27.0–33.0)
MCHC: 32.6 g/dL (ref 32.0–36.0)
MCV: 88.3 fL (ref 80.0–100.0)
MONO ABS: 684 {cells}/uL (ref 200–950)
MPV: 9.8 fL (ref 7.5–12.5)
Monocytes Relative: 9 %
NEUTROS ABS: 4864 {cells}/uL (ref 1500–7800)
Neutrophils Relative %: 64 %
Platelets: 304 10*3/uL (ref 140–400)
RBC: 4.86 MIL/uL (ref 3.80–5.10)
RDW: 13.8 % (ref 11.0–15.0)
WBC: 7.6 10*3/uL (ref 3.8–10.8)

## 2016-03-10 LAB — LIPID PANEL
Cholesterol: 336 mg/dL — ABNORMAL HIGH (ref <200)
HDL: 50 mg/dL — AB (ref >50)
LDL Cholesterol: 240 mg/dL — ABNORMAL HIGH (ref <100)
TRIGLYCERIDES: 230 mg/dL — AB (ref <150)
Total CHOL/HDL Ratio: 6.7 Ratio — ABNORMAL HIGH (ref <5.0)
VLDL: 46 mg/dL — ABNORMAL HIGH (ref <30)

## 2016-03-10 MED ORDER — VALACYCLOVIR HCL 500 MG PO TABS: ORAL_TABLET | ORAL | 12 refills | Status: DC

## 2016-03-10 NOTE — Patient Instructions (Addendum)
Health Maintenance for Postmenopausal Women Introduction Menopause is a normal process in which your reproductive ability comes to an end. This process happens gradually over a span of months to years, usually between the ages of 20 and 50. Menopause is complete when you have missed 12 consecutive menstrual periods. It is important to talk with your health care provider about some of the most common conditions that affect postmenopausal women, such as heart disease, cancer, and bone loss (osteoporosis). Adopting a healthy lifestyle and getting preventive care can help to promote your health and wellness. Those actions can also lower your chances of developing some of these common conditions. What should I know about menopause? During menopause, you may experience a number of symptoms, such as:  Moderate-to-severe hot flashes.  Night sweats.  Decrease in sex drive.  Mood swings.  Headaches.  Tiredness.  Irritability.  Memory problems.  Insomnia. Choosing to treat or not to treat menopausal changes is an individual decision that you make with your health care provider. What should I know about hormone replacement therapy and supplements? Hormone therapy products are effective for treating symptoms that are associated with menopause, such as hot flashes and night sweats. Hormone replacement carries certain risks, especially as you become older. If you are thinking about using estrogen or estrogen with progestin treatments, discuss the benefits and risks with your health care provider. What should I know about heart disease and stroke? Heart disease, heart attack, and stroke become more likely as you age. This may be due, in part, to the hormonal changes that your body experiences during menopause. These can affect how your body processes dietary fats, triglycerides, and cholesterol. Heart attack and stroke are both medical emergencies. There are many things that you can do to help prevent  heart disease and stroke:  Have your blood pressure checked at least every 1-2 years. High blood pressure causes heart disease and increases the risk of stroke.  If you are 49-18 years old, ask your health care provider if you should take aspirin to prevent a heart attack or a stroke.  Do not use any tobacco products, including cigarettes, chewing tobacco, or electronic cigarettes. If you need help quitting, ask your health care provider.  It is important to eat a healthy diet and maintain a healthy weight.  Be sure to include plenty of vegetables, fruits, low-fat dairy products, and lean protein.  Avoid eating foods that are high in solid fats, added sugars, or salt (sodium).  Get regular exercise. This is one of the most important things that you can do for your health.  Try to exercise for at least 150 minutes each week. The type of exercise that you do should increase your heart rate and make you sweat. This is known as moderate-intensity exercise.  Try to do strengthening exercises at least twice each week. Do these in addition to the moderate-intensity exercise.  Know your numbers.Ask your health care provider to check your cholesterol and your blood glucose. Continue to have your blood tested as directed by your health care provider. What should I know about cancer screening? There are several types of cancer. Take the following steps to reduce your risk and to catch any cancer development as early as possible. Breast Cancer  Practice breast self-awareness.  This means understanding how your breasts normally appear and feel.  It also means doing regular breast self-exams. Let your health care provider know about any changes, no matter how small.  If you are 40 or  older, have a clinician do a breast exam (clinical breast exam or CBE) every year. Depending on your age, family history, and medical history, it may be recommended that you also have a yearly breast X-ray  (mammogram).  If you have a family history of breast cancer, talk with your health care provider about genetic screening.  If you are at high risk for breast cancer, talk with your health care provider about having an MRI and a mammogram every year.  Breast cancer (BRCA) gene test is recommended for women who have family members with BRCA-related cancers. Results of the assessment will determine the need for genetic counseling and BRCA1 and for BRCA2 testing. BRCA-related cancers include these types:  Breast. This occurs in males or females.  Ovarian.  Tubal. This may also be called fallopian tube cancer.  Cancer of the abdominal or pelvic lining (peritoneal cancer).  Prostate.  Pancreatic. Cervical, Uterine, and Ovarian Cancer  Your health care provider may recommend that you be screened regularly for cancer of the pelvic organs. These include your ovaries, uterus, and vagina. This screening involves a pelvic exam, which includes checking for microscopic changes to the surface of your cervix (Pap test).  For women ages 21-65, health care providers may recommend a pelvic exam and a Pap test every three years. For women ages 50-65, they may recommend the Pap test and pelvic exam, combined with testing for human papilloma virus (HPV), every five years. Some types of HPV increase your risk of cervical cancer. Testing for HPV may also be done on women of any age who have unclear Pap test results.  Other health care providers may not recommend any screening for nonpregnant women who are considered low risk for pelvic cancer and have no symptoms. Ask your health care provider if a screening pelvic exam is right for you.  If you have had past treatment for cervical cancer or a condition that could lead to cancer, you need Pap tests and screening for cancer for at least 20 years after your treatment. If Pap tests have been discontinued for you, your risk factors (such as having a new sexual  partner) need to be reassessed to determine if you should start having screenings again. Some women have medical problems that increase the chance of getting cervical cancer. In these cases, your health care provider may recommend that you have screening and Pap tests more often.  If you have a family history of uterine cancer or ovarian cancer, talk with your health care provider about genetic screening.  If you have vaginal bleeding after reaching menopause, tell your health care provider.  There are currently no reliable tests available to screen for ovarian cancer. Lung Cancer  Lung cancer screening is recommended for adults 59-37 years old who are at high risk for lung cancer because of a history of smoking. A yearly low-dose CT scan of the lungs is recommended if you:  Currently smoke.  Have a history of at least 30 pack-years of smoking and you currently smoke or have quit within the past 15 years. A pack-year is smoking an average of one pack of cigarettes per day for one year. Yearly screening should:  Continue until it has been 15 years since you quit.  Stop if you develop a health problem that would prevent you from having lung cancer treatment. Colorectal Cancer  This type of cancer can be detected and can often be prevented.  Routine colorectal cancer screening usually begins at age 9 and  continues through age 73.  If you have risk factors for colon cancer, your health care provider may recommend that you be screened at an earlier age.  If you have a family history of colorectal cancer, talk with your health care provider about genetic screening.  Your health care provider may also recommend using home test kits to check for hidden blood in your stool.  A small camera at the end of a tube can be used to examine your colon directly (sigmoidoscopy or colonoscopy). This is done to check for the earliest forms of colorectal cancer.  Direct examination of the colon should be  repeated every 5-10 years until age 39. However, if early forms of precancerous polyps or small growths are found or if you have a family history or genetic risk for colorectal cancer, you may need to be screened more often. Skin Cancer  Check your skin from head to toe regularly.  Monitor any moles. Be sure to tell your health care provider:  About any new moles or changes in moles, especially if there is a change in a mole's shape or color.  If you have a mole that is larger than the size of a pencil eraser.  If any of your family members has a history of skin cancer, especially at a young age, talk with your health care provider about genetic screening.  Always use sunscreen. Apply sunscreen liberally and repeatedly throughout the day.  Whenever you are outside, protect yourself by wearing long sleeves, pants, a wide-brimmed hat, and sunglasses. What should I know about osteoporosis? Osteoporosis is a condition in which bone destruction happens more quickly than new bone creation. After menopause, you may be at an increased risk for osteoporosis. To help prevent osteoporosis or the bone fractures that can happen because of osteoporosis, the following is recommended:  If you are 59-26 years old, get at least 1,000 mg of calcium and at least 600 mg of vitamin D per day.  If you are older than age 1 but younger than age 67, get at least 1,200 mg of calcium and at least 600 mg of vitamin D per day.  If you are older than age 38, get at least 1,200 mg of calcium and at least 800 mg of vitamin D per day. Smoking and excessive alcohol intake increase the risk of osteoporosis. Eat foods that are rich in calcium and vitamin D, and do weight-bearing exercises several times each week as directed by your health care provider. What should I know about how menopause affects my mental health? Depression may occur at any age, but it is more common as you become older. Common symptoms of depression  include:  Low or sad mood.  Changes in sleep patterns.  Changes in appetite or eating patterns.  Feeling an overall lack of motivation or enjoyment of activities that you previously enjoyed.  Frequent crying spells. Talk with your health care provider if you think that you are experiencing depression. What should I know about immunizations? It is important that you get and maintain your immunizations. These include:  Tetanus, diphtheria, and pertussis (Tdap) booster vaccine.  Influenza every year before the flu season begins.  Pneumonia vaccine.  Shingles vaccine. Your health care provider may also recommend other immunizations. This information is not intended to replace advice given to you by your health care provider. Make sure you discuss any questions you have with your health care provider. Document Released: 02/24/2005 Document Revised: 07/23/2015 Document Reviewed: 10/06/2014  2017  Elsevier Carbohydrate Counting for Diabetes Mellitus, Adult Carbohydrate counting is a method for keeping track of how many carbohydrates you eat. Eating carbohydrates naturally increases the amount of sugar (glucose) in the blood. Counting how many carbohydrates you eat helps keep your blood glucose within normal limits, which helps you manage your diabetes (diabetes mellitus). It is important to know how many carbohydrates you can safely have in each meal. This is different for every person. A diet and nutrition specialist (registered dietitian) can help you make a meal plan and calculate how many carbohydrates you should have at each meal and snack. Carbohydrates are found in the following foods:  Grains, such as breads and cereals.  Dried beans and soy products.  Starchy vegetables, such as potatoes, peas, and corn.  Fruit and fruit juices.  Milk and yogurt.  Sweets and snack foods, such as cake, cookies, candy, chips, and soft drinks. How do I count carbohydrates? There are two ways  to count carbohydrates in food. You can use either of the methods or a combination of both. Reading "Nutrition Facts" on packaged food  The "Nutrition Facts" list is included on the labels of almost all packaged foods and beverages in the [place] It includes:  The serving size.  Information about nutrients in each serving, including the grams (g) of carbohydrate per serving. To use the "Nutrition Facts":  Decide how many servings you will have.  Multiply the number of servings by the number of carbohydrates per serving.  The resulting number is the total amount of carbohydrates that you will be having. Learning standard serving sizes of other foods  When you eat foods containing carbohydrates that are not packaged or do not include "Nutrition Facts" on the label, you need to measure the servings in order to count the amount of carbohydrates:  Measure the foods that you will eat with a food scale or measuring cup, if needed.  Decide how many standard-size servings you will eat.  Multiply the number of servings by 15. Most carbohydrate-rich foods have about 15 g of carbohydrates per serving.  For example, if you eat 8 oz (170 g) of strawberries, you will have eaten 2 servings and 30 g of carbohydrates (2 servings x 15 g = 30 g).  For foods that have more than one food mixed, such as soups and casseroles, you must count the carbohydrates in each food that is included. The following list contains standard serving sizes of common carbohydrate-rich foods. Each of these servings has about 15 g of carbohydrates:   hamburger bun or  English muffin.   oz (15 mL) syrup.   oz (14 g) jelly.  1 slice of bread.  1 six-inch tortilla.  3 oz (85 g) cooked rice or pasta.  4 oz (113 g) cooked dried beans.  4 oz (113 g) starchy vegetable, such as peas, corn, or potatoes.  4 oz (113 g) hot cereal.  4 oz (113 g) mashed potatoes or  of a large baked potato.  4 oz (113 g) canned or frozen  fruit.  4 oz (120 mL) fruit juice.  4-6 crackers.  6 chicken nuggets.  6 oz (170 g) unsweetened dry cereal.  6 oz (170 g) plain fat-free yogurt or yogurt sweetened with artificial sweeteners.  8 oz (240 mL) milk.  8 oz (170 g) fresh fruit or one small piece of fruit.  24 oz (680 g) popped popcorn. Example of carbohydrate counting Sample meal  3 oz (85 g) chicken breast.    6 oz (170 g) brown rice.  4 oz (113 g) corn.  8 oz (240 mL) milk.  8 oz (170 g) strawberries with sugar-free whipped topping. Carbohydrate calculation 1. Identify the foods that contain carbohydrates:  Rice.  Corn.  Milk.  Strawberries. 2. Calculate how many servings you have of each food:  2 servings rice.  1 serving corn.  1 serving milk.  1 serving strawberries. 3. Multiply each number of servings by 15 g:  2 servings rice x 15 g = 30 g.  1 serving corn x 15 g = 15 g.  1 serving milk x 15 g = 15 g.  1 serving strawberries x 15 g = 15 g. 4. Add together all of the amounts to find the total grams of carbohydrates eaten:  30 g + 15 g + 15 g + 15 g = 75 g of carbohydrates total. This information is not intended to replace advice given to you by your health care provider. Make sure you discuss any questions you have with your health care provider. Document Released: 01/02/2005 Document Revised: 07/23/2015 Document Reviewed: 06/16/2015 Elsevier Interactive Patient Education  2017 [employer].

## 2016-03-10 NOTE — Telephone Encounter (Signed)
Corrected Rx sent to pharmacy.

## 2016-03-10 NOTE — Telephone Encounter (Signed)
Pharmacy sent a note to clarify directions.  The directions you sent are "Take tablets by mouth twice daily for 3-5 days as needed".  Pharmacy needs to know how many tablets to take?

## 2016-03-10 NOTE — Progress Notes (Signed)
Veronica Leonard [birth year] [id number]    History:    Presents for annual exam.  Postmenopausal on no HRT reports 1 day of bright red spotting March 2017 painless. Rare intercourse husbands health. Negative colonoscopy 2009. Normal Pap and mammogram history.  Past medical history, past surgical history, family history and social history were all reviewed and documented in the EPIC chart. Works at a daycare part-time.  ROS:  A ROS was performed and pertinent positives and negatives are included.  Exam:  Vitals:   03/10/16 0857  BP: 128/86  Weight: 194 lb (88 kg)  Height: 5' 2.25" (1.581 m)   Body mass index is 35.2 kg/m.   General appearance:  Normal Thyroid:  Symmetrical, normal in size, without palpable masses or nodularity. Respiratory  Auscultation:  Clear without wheezing or rhonchi Cardiovascular  Auscultation:  Regular rate, without rubs, murmurs or gallops  Edema/varicosities:  Not grossly evident Abdominal  Soft,nontender, without masses, guarding or rebound.  Liver/spleen:  No organomegaly noted  Hernia:  None appreciated  Skin  Inspection:  Grossly normal   Breasts: Examined lying and sitting.     Right: Without masses, retractions, discharge or axillary adenopathy.     Left: Without masses, retractions, discharge or axillary adenopathy. Gentitourinary   Inguinal/mons:  Normal without inguinal adenopathy  External genitalia:  Normal  BUS/Urethra/Skene's glands:  Normal  Vagina:  Atrophic  Cervix:  Normal  Uterus:  normal in size, shape and contour.  Midline and mobile  Adnexa/parametria:     Rt: Without masses or tenderness.   Lt: Without masses or tenderness.  Anus and perineum: Normal  Digital rectal exam: Normal sphincter tone without palpated masses or tenderness  Assessment/Plan:  61 y.o. M WF G3 P3 for annual exam with no complaints.  Postmenopausal/no HRT history of one day spotting March 2017 Obesity HSV-1  Plan: Valtrex 500 twice daily for  3-5 days when necessary prescription, proper use given and reviewed. Reviewed importance of calling if any further bleeding/ spotting. Reviewed not normal to bleed after menopause. SBE's, continue annual screening mammogram, 3-D tomography reviewed and encouraged. Continue vitamin D 2000 daily. History of vitamin D deficiency. Reviewed importance of increasing exercise and decreasing calories for weight loss. Low carb diet. CBC, glucose, lipid panel, vitamin D, Pap normal 2016, new screening guidelines reviewed. Blood pressure slightly elevated instructed to recheck at home if continues greater than 130/80 follow-up with primary care for possible medication. T dap given.    Huel Cote [facility] [place], 9:39 AM 03/10/2016

## 2016-03-10 NOTE — Telephone Encounter (Signed)
1 tablet twice daily for 3-5 days as needed

## 2016-03-11 LAB — VITAMIN D 25 HYDROXY (VIT D DEFICIENCY, FRACTURES): VIT D 25 HYDROXY: 31 ng/mL (ref 30–100)

## 2016-07-13 LAB — PROTEIN, TOTAL: TOTAL PROTEIN: 6.9

## 2016-07-13 LAB — LIPID PANEL
Cholesterol: 345 — AB (ref 0–200)
HDL: 51 (ref 35–70)
LDL Cholesterol: 258
Triglycerides: 176 — AB (ref 40–160)

## 2016-07-13 LAB — HEPATIC FUNCTION PANEL
ALT: 22 (ref 7–35)
AST: 24 (ref 13–35)
Alkaline Phosphatase: 70 (ref 25–125)

## 2016-07-13 LAB — BASIC METABOLIC PANEL
CREATININE: 0.8 (ref <=1.1)
GLUCOSE: 98

## 2016-07-13 LAB — GAMMA GT: GGT: 24 (ref 10–52)

## 2016-08-29 ENCOUNTER — Encounter: Payer: Self-pay | Admitting: Adult Health

## 2016-08-29 ENCOUNTER — Ambulatory Visit (INDEPENDENT_AMBULATORY_CARE_PROVIDER_SITE_OTHER): Payer: BLUE CROSS/BLUE SHIELD | Admitting: Adult Health

## 2016-08-29 DIAGNOSIS — Z0000 Encounter for general adult medical examination without abnormal findings: Secondary | ICD-10-CM | POA: Diagnosis not present

## 2016-08-29 DIAGNOSIS — E782 Mixed hyperlipidemia: Secondary | ICD-10-CM

## 2016-08-29 DIAGNOSIS — J302 Other seasonal allergic rhinitis: Secondary | ICD-10-CM | POA: Diagnosis not present

## 2016-08-29 DIAGNOSIS — E785 Hyperlipidemia, unspecified: Secondary | ICD-10-CM | POA: Insufficient documentation

## 2016-08-29 NOTE — Progress Notes (Signed)
Subjective:    Patient ID: ZENITA KISTER, female    DOB: April 15, 1955, 62 y.o.   MRN: [id number]  HPI:  Ms. Carano presents to establish as a new pt. He is a very pleasant 61 year old female.  PMH:  HL and late-onset menopause (started at age 83).  She has not had PCP in years, care has been managed by her OB/GYN.  She denies cardiac/respiratory/endocrine disease.  She denies chronic pain.  She estimates to drink 40-50 ounces water/day and has a diet that is moderate-high in saturated fat/CHO.  She denies formal exercise, however strives to walk as often as she can.  She has sig family hx of CAD and T2D.  Her lipid panel has been stable, however steadily climbing and last check with life insurance carrier revealed quire elevated numbers.  She denies tobacco/EOTH use. She has two acute complaints: 1) intermittent nose bleeds over the last month 2) "clogged ears".   She feels that seasonal allergies have been affecting her more in the last 12 months and she has been taking 1/2 OTC Certirizine QHS with minimal sx relief. She states "I just want to let you know that I am not a great pill taker".   Patient Care Team    Relationship Specialty Notifications Start End  [name] D, NP PCP - General Family Medicine  08/29/16   Huel Cote, NP Nurse Practitioner Obstetrics and Gynecology  08/29/16     Patient Active Problem List   Diagnosis Date Noted  . Health care maintenance 08/29/2016  . Hyperlipidemia 08/29/2016  . Seasonal allergies   . Anxiety   . Benign colonic polyp      Past Medical History:  Diagnosis Date  . Anxiety    SITUATIONAL  . Hyperlipidemia   . Seasonal allergies      History reviewed. No pertinent surgical history.   Family History  Problem Relation Age of Onset  . Diabetes Father   . Hypertension Sister   . Heart disease Sister   . Heart murmur Sister   . Diabetes Brother      History  Drug Use No     History  Alcohol Use  . Yes    Comment:  social     History  Smoking Status  . Never Smoker  Smokeless Tobacco  . Never Used     Outpatient Encounter Prescriptions as of 08/29/2016  Medication Sig  . Calcium Carbonate Antacid (TUMS PO) Take by mouth as needed.  . cetirizine (ZYRTEC) 10 MG tablet 1/2 tab prn  . Ibuprofen (MIDOL PO) Take by mouth as needed.  . magnesium 30 MG tablet Take 30 mg by mouth 2 (two) times daily.  . valACYclovir (VALTREX) 500 MG tablet Take ONE TAB twice daily for 3-5 days as needed  . [DISCONTINUED] Naproxen Sodium (ALEVE PO) Take by mouth as needed.  . [DISCONTINUED] Vitamin D, Ergocalciferol, (DRISDOL) 50000 UNITS CAPS capsule Take 1 capsule (50,000 Units total) by mouth every 7 (seven) days.   No facility-administered encounter medications on file as of 08/29/2016.     Allergies: Patient has no known allergies.  Body mass index is 34.58 kg/m.  Blood pressure 127/74, pulse 69, height 5' 2.25" (1.581 m), weight 190 lb 9.6 oz (86.5 kg), last menstrual period 09/14/2012.     Review of Systems  Constitutional: Positive for fatigue. Negative for activity change, appetite change, chills and diaphoresis.  HENT: Positive for congestion, postnasal drip and rhinorrhea. Negative for sinus pain,  sinus pressure, sore throat, trouble swallowing and voice change.   Eyes: Negative for visual disturbance.  Respiratory: Negative for cough, chest tightness, shortness of breath, wheezing and stridor.   Cardiovascular: Negative for chest pain, palpitations and leg swelling.  Gastrointestinal: Negative for abdominal distention, abdominal pain, blood in stool, constipation, diarrhea, nausea and vomiting.  Endocrine: Negative for cold intolerance, heat intolerance, polydipsia, polyphagia and polyuria.  Genitourinary: Negative for difficulty urinating, flank pain and hematuria.  Musculoskeletal: Negative for arthralgias, back pain, gait problem, joint swelling, myalgias, neck pain and neck stiffness.  Skin:  Negative for color change, pallor, rash and wound.  Neurological: Negative for dizziness and tremors.  Hematological: Does not bruise/bleed easily.  Psychiatric/Behavioral: Negative for dysphoric mood, self-injury, sleep disturbance and suicidal ideas.       Objective:   Physical Exam  Constitutional: She is oriented to person, place, and time. She appears well-developed and well-nourished.  HENT:  Head: Normocephalic and atraumatic.  Right Ear: Hearing, external ear and ear canal normal. Tympanic membrane is bulging. Tympanic membrane is not perforated. No decreased hearing is noted.  Left Ear: Hearing, external ear and ear canal normal. Tympanic membrane is bulging.  Nose: Mucosal edema and rhinorrhea present. Right sinus exhibits no maxillary sinus tenderness and no frontal sinus tenderness. Left sinus exhibits no maxillary sinus tenderness and no frontal sinus tenderness.  Mouth/Throat: Uvula is midline, oropharynx is clear and moist and mucous membranes are normal.  Eyes: Pupils are equal, round, and reactive to light. Conjunctivae are normal.  Neck: Normal range of motion. Neck supple.  Cardiovascular: Normal rate, regular rhythm and intact distal pulses.   No murmur heard. Pulmonary/Chest: Effort normal and breath sounds normal. No respiratory distress. She has no wheezes. She has no rales. She exhibits no tenderness.  Lymphadenopathy:    She has no cervical adenopathy.  Neurological: She is alert and oriented to person, place, and time. Coordination normal.  Skin: Skin is warm. No rash noted. No erythema. No pallor.  Psychiatric: She has a normal mood and affect. Her behavior is normal. Judgment and thought content normal.  Nursing note and vitals reviewed.         Assessment & Plan:   1. Seasonal allergic rhinitis, unspecified trigger   2. Health care maintenance   3. Mixed hyperlipidemia     Seasonal allergies Continue with nightly 1/2- 1 tab OTC Cetirizine  10mg . Continue with excellent water intake. If intermittent bloody nose persists >1 month then please call clinic and we will refer to ENT.  Health care maintenance Strive to drink at least 80 ounces water/daily. Increase regular exercise-walking, biking, swimming, YouTube and Pinterest workout vidoes. CPE with lipid panel in Nov 2018  Hyperlipidemia 07/13/16: Tot chol 345 HDL 51 LDL 285 TGs 176 ASCVD risk 4.6%, optimal 1.8% Since risk is <5% statin therapy is not necessarily indicated. Stressed TLC and if numbers aren't improved at CPE will discuss beginning statin.     FOLLOW-UP:  Return in about 3 months (around 11/29/2016) for CPE, Fasting Lab Draw.

## 2016-08-29 NOTE — Assessment & Plan Note (Addendum)
07/13/16: Tot chol 345 HDL 51 LDL 285 TGs 176 ASCVD risk 4.6%, optimal 1.8% Since risk is <5% statin therapy is not necessarily indicated. Stressed TLC and if numbers aren't improved at CPE will discuss beginning statin.

## 2016-08-29 NOTE — Assessment & Plan Note (Signed)
Continue with nightly 1/2- 1 tab OTC Cetirizine 10mg . Continue with excellent water intake. If intermittent bloody nose persists >1 month then please call clinic and we will refer to ENT.

## 2016-08-29 NOTE — Assessment & Plan Note (Signed)
Strive to drink at least 80 ounces water/daily. Increase regular exercise-walking, biking, swimming, YouTube and Pinterest workout vidoes. CPE with lipid panel in Nov 2018

## 2016-08-29 NOTE — Patient Instructions (Signed)
Fat and Cholesterol Restricted Diet High levels of fat and cholesterol in your blood may lead to various health problems, such as diseases of the heart, blood vessels, gallbladder, liver, and pancreas. Fats are concentrated sources of energy that come in various forms. Certain types of fat, including saturated fat, may be harmful in excess. Cholesterol is a substance needed by your body in small amounts. Your body makes all the cholesterol it needs. Excess cholesterol comes from the food you eat. When you have high levels of cholesterol and saturated fat in your blood, health problems can develop because the excess fat and cholesterol will gather along the walls of your blood vessels, causing them to narrow. Choosing the right foods will help you control your intake of fat and cholesterol. This will help keep the levels of these substances in your blood within normal limits and reduce your risk of disease. What is my plan? Your health care provider recommends that you:  Limit your fat intake to 25% or less of your total calories per day.  Eat 20-30 grams of fiber each day.  What types of fat should I choose?  Choose healthy fats more often. Choose monounsaturated and polyunsaturated fats, such as olive and canola oil, flaxseeds, walnuts, almonds, and seeds.  Eat more omega-3 fats. Good choices include salmon, mackerel, sardines, tuna, flaxseed oil, and ground flaxseeds. Aim to eat fish at least two times a week.  Limit saturated fats. Saturated fats are primarily found in animal products, such as meats, butter, and cream. Plant sources of saturated fats include palm oil, palm kernel oil, and coconut oil.  Avoid foods with partially hydrogenated oils in them. These contain trans fats. Examples of foods that contain trans fats are stick margarine, some tub margarines, cookies, crackers, and other baked goods. What general guidelines do I need to follow? These guidelines for healthy eating will help  you control your intake of fat and cholesterol:  Check food labels carefully to identify foods with trans fats or high amounts of saturated fat.  Fill one half of your plate with vegetables and green salads.  Fill one fourth of your plate with whole grains. Look for the word "whole" as the first word in the ingredient list.  Fill one fourth of your plate with lean protein foods.  Limit fruit to two servings a day. Choose fruit instead of juice.  Eat more foods that contain fiber, such as apples, broccoli, carrots, beans, peas, and barley.  Eat more home-cooked food and less restaurant, buffet, and fast food.  Limit or avoid alcohol.  Limit foods high in starch and sugar.  Limit fried foods.  Cook foods using methods other than frying. Baking, boiling, grilling, and broiling are all great options.  Lose weight if you are overweight. Losing just 5-10% of your initial body weight can help your overall health and prevent diseases such as diabetes and heart disease.  What foods can I eat? Grains  Whole grains, such as whole wheat or whole grain breads, crackers, cereals, and pasta. Unsweetened oatmeal, bulgur, barley, quinoa, or brown rice. Corn or whole wheat flour tortillas. Vegetables  Fresh or frozen vegetables (raw, steamed, roasted, or grilled). Green salads. Fruits  All fresh, canned (in natural juice), or frozen fruits. Meats and other protein foods  Ground beef (85% or leaner), grass-fed beef, or beef trimmed of fat. Skinless chicken or [place]. Ground chicken or [place]. Pork trimmed of fat. All fish and seafood. Eggs. Dried beans, peas, or lentils. Unsalted  nuts or seeds. Unsalted canned or dry beans. Dairy  Low-fat dairy products, such as skim or 1% milk, 2% or reduced-fat cheeses, low-fat ricotta or cottage cheese, or plain low-fat yo Fats and oils  Tub margarines without trans fats. Light or reduced-fat mayonnaise and salad dressings. Avocado. Olive, canola, sesame,  or safflower oils. Natural peanut or almond butter (choose ones without added sugar and oil). The items listed above may not be a complete list of recommended foods or beverages. Contact your dietitian for more options. Foods to avoid Grains  White bread. White pasta. White rice. Cornbread. Bagels, pastries, and croissants. Crackers that contain trans fat. Vegetables  White potatoes. Corn. Creamed or fried vegetables. Vegetables in a cheese sauce. Fruits  Dried fruits. Canned fruit in light or heavy syrup. Fruit juice. Meats and other protein foods  Fatty cuts of meat. Ribs, chicken wings, bacon, sausage, bologna, salami, chitterlings, fatback, hot dogs, bratwurst, and packaged luncheon meats. Liver and organ meats. Dairy  Whole or 2% milk, cream, half-and-half, and cream cheese. Whole milk cheeses. Whole-fat or sweetened yogurt. Full-fat cheeses. Nondairy creamers and whipped toppings. Processed cheese, cheese spreads, or cheese curds. Beverages  Alcohol. Sweetened drinks (such as sodas, lemonade, and fruit drinks or punches). Fats and oils  Butter, stick margarine, lard, shortening, ghee, or bacon fat. Coconut, palm kernel, or palm oils. Sweets and desserts  Corn syrup, sugars, honey, and molasses. Candy. Jam and jelly. Syrup. Sweetened cereals. Cookies, pies, cakes, donuts, muffins, and ice cream. The items listed above may not be a complete list of foods and beverages to avoid. Contact your dietitian for more information. This information is not intended to replace advice given to you by your health care provider. Make sure you discuss any questions you have with your health care provider. Document Released: 01/02/2005 Document Revised: 01/23/2014 Document Reviewed: 04/02/2013 Elsevier Interactive Patient Education  2017 [place].  Allergic Rhinitis Allergic rhinitis is when the mucous membranes in the nose respond to allergens. Allergens are particles in the air that cause  your body to have an allergic reaction. This causes you to release allergic antibodies. Through a chain of events, these eventually cause you to release histamine into the blood stream. Although meant to protect the body, it is this release of histamine that causes your discomfort, such as frequent sneezing, congestion, and an itchy, runny nose. What are the causes? Seasonal allergic rhinitis (hay fever) is caused by pollen allergens that may come from grasses, trees, and weeds. Year-round allergic rhinitis (perennial allergic rhinitis) is caused by allergens such as house dust mites, pet dander, and mold spores. What are the signs or symptoms?  Nasal stuffiness (congestion).  Itchy, runny nose with sneezing and tearing of the eyes. How is this diagnosed? Your health care provider can help you determine the allergen or allergens that trigger your symptoms. If you and your health care provider are unable to determine the allergen, skin or blood testing may be used. Your health care provider will diagnose your condition after taking your health history and performing a physical exam. Your health care provider may assess you for other related conditions, such as asthma, pink eye, or an ear infection. How is this treated? Allergic rhinitis does not have a cure, but it can be controlled by:  Medicines that block allergy symptoms. These may include allergy shots, nasal sprays, and oral antihistamines.  Avoiding the allergen.  Hay fever may often be treated with antihistamines in pill or nasal spray forms. Antihistamines  block the effects of histamine. There are over-the-counter medicines that may help with nasal congestion and swelling around the eyes. Check with your health care provider before taking or giving this medicine. If avoiding the allergen or the medicine prescribed do not work, there are many new medicines your health care provider can prescribe. Stronger medicine may be used if initial  measures are ineffective. Desensitizing injections can be used if medicine and avoidance does not work. Desensitization is when a patient is given ongoing shots until the body becomes less sensitive to the allergen. Make sure you follow up with your health care provider if problems continue. Follow these instructions at home: It is not possible to completely avoid allergens, but you can reduce your symptoms by taking steps to limit your exposure to them. It helps to know exactly what you are allergic to so that you can avoid your specific triggers. Contact a health care provider if:  You have a fever.  You develop a cough that does not stop easily (persistent).  You have shortness of breath.  You start wheezing.  Symptoms interfere with normal daily activities. This information is not intended to replace advice given to you by your health care provider. Make sure you discuss any questions you have with your health care provider. Document Released: 09/27/2000 Document Revised: 09/03/2015 Document Reviewed: 09/09/2012 Elsevier Interactive Patient Education  2017 [place].  Increase water intake to at least 80 ounces/day and follow heart healthy diet. Increase regular movement, strive for at least 180mins/week-walking, biking, swimming, YouTube/Pinterest Videos. If nose bleeds persist >1 month, please call clinic and we will refer to ENT. Please schedule complete physical in Nov and we will re-check cholesterol-if they remain elevated we will discuss starting a statin. WELCOME TO THE PRACTICE!

## 2016-11-22 ENCOUNTER — Other Ambulatory Visit: Payer: BLUE CROSS/BLUE SHIELD

## 2016-11-22 DIAGNOSIS — Z0000 Encounter for general adult medical examination without abnormal findings: Secondary | ICD-10-CM

## 2016-11-22 DIAGNOSIS — E785 Hyperlipidemia, unspecified: Secondary | ICD-10-CM

## 2016-11-23 LAB — COMPREHENSIVE METABOLIC PANEL
ALBUMIN: 4.4 g/dL (ref 3.5–5.5)
ALT: 17 IU/L (ref 0–32)
AST: 22 IU/L (ref 0–40)
Albumin/Globulin Ratio: 1.9 (ref 1.2–2.2)
Alkaline Phosphatase: 69 IU/L (ref 39–117)
BILIRUBIN TOTAL: 0.4 mg/dL (ref 0.0–1.2)
BUN / CREAT RATIO: 20 (ref 9–23)
BUN: 14 mg/dL (ref 6–24)
CHLORIDE: 101 mmol/L (ref 96–106)
CO2: 28 mmol/L (ref 20–29)
Calcium: 9.4 mg/dL (ref 8.7–10.2)
Creatinine, Ser: 0.71 mg/dL (ref 0.57–1.00)
GFR calc non Af Amer: 94 mL/min/{1.73_m2} (ref >59)
GFR, EST AFRICAN AMERICAN: 108 mL/min/{1.73_m2} (ref >59)
GLUCOSE: 93 mg/dL (ref 65–99)
Globulin, Total: 2.3 g/dL (ref 1.5–4.5)
Potassium: 5.1 mmol/L (ref 3.5–5.2)
Sodium: 141 mmol/L (ref 134–144)
TOTAL PROTEIN: 6.7 g/dL (ref 6.0–8.5)

## 2016-11-23 LAB — VITAMIN D 25 HYDROXY (VIT D DEFICIENCY, FRACTURES): VIT D 25 HYDROXY: 34.5 ng/mL (ref 30.0–100.0)

## 2016-11-23 LAB — CBC WITH DIFFERENTIAL/PLATELET
BASOS: 1 %
Basophils Absolute: 0 10*3/uL (ref 0.0–0.2)
EOS (ABSOLUTE): 0.2 10*3/uL (ref 0.0–0.4)
EOS: 4 %
HEMOGLOBIN: 13.3 g/dL (ref 11.1–15.9)
Hematocrit: 39.7 % (ref 34.0–46.6)
IMMATURE GRANS (ABS): 0 10*3/uL (ref 0.0–0.1)
Immature Granulocytes: 0 %
LYMPHS: 24 %
Lymphocytes Absolute: 1.4 10*3/uL (ref 0.7–3.1)
MCH: 29 pg (ref 26.6–33.0)
MCHC: 33.5 g/dL (ref 31.5–35.7)
MCV: 87 fL (ref 79–97)
MONOCYTES: 8 %
Monocytes Absolute: 0.5 10*3/uL (ref 0.1–0.9)
NEUTROS ABS: 3.6 10*3/uL (ref 1.4–7.0)
Neutrophils: 63 %
Platelets: 276 10*3/uL (ref 150–379)
RBC: 4.59 x10E6/uL (ref 3.77–5.28)
RDW: 13.9 % (ref 12.3–15.4)
WBC: 5.7 10*3/uL (ref 3.4–10.8)

## 2016-11-23 LAB — LIPID PANEL
CHOL/HDL RATIO: 6.4 ratio — AB (ref 0.0–4.4)
Cholesterol, Total: 300 mg/dL — ABNORMAL HIGH (ref 100–199)
HDL: 47 mg/dL (ref >39)
LDL CALC: 217 mg/dL — AB (ref 0–99)
Triglycerides: 179 mg/dL — ABNORMAL HIGH (ref 0–149)
VLDL CHOLESTEROL CAL: 36 mg/dL (ref 5–40)

## 2016-11-23 LAB — HEMOGLOBIN A1C
Est. average glucose Bld gHb Est-mCnc: 120 mg/dL
Hgb A1c MFr Bld: 5.8 % — ABNORMAL HIGH (ref 4.8–5.6)

## 2016-11-23 LAB — TSH: TSH: 1.59 u[IU]/mL (ref 0.450–4.500)

## 2016-11-27 NOTE — Progress Notes (Signed)
Subjective:    Patient ID: Veronica Leonard, female    DOB: 1956/01/14, 61 y.o.   MRN: [id number]  HPI: 08/29/16 OV:  [name] presents to establish as a new pt. She is a very pleasant 61 year old female.  PMH:  HL and late-onset menopause (started at age 70).  She has not had PCP in years, care has been managed by her OB/GYN.  She denies cardiac/respiratory/endocrine disease.  She denies chronic pain.  She estimates to drink 40-50 ounces water/day and has a diet that is moderate-high in saturated fat/CHO.  She denies formal exercise, however strives to walk as often as she can.  She has sig family hx of CAD and T2D.  Her lipid panel has been stable, however steadily climbing and last check with life insurance carrier revealed quire elevated numbers.  She denies tobacco/EOTH use. She has two acute complaints: 1) intermittent nose bleeds over the last month 2) "clogged ears".   She feels that seasonal allergies have been affecting her more in the last 12 months and she has been taking 1/2 OTC Certirizine QHS with minimal sx relief. She states "I just want to let you know that I am not a great pill taker".  11/29/16 OV: [name] is here for CPE and to review recent fasting labs.  She estimates to drink 80 oz water/day and follows a heart healthy diet, however states "I just love my toast and butter every morning".  She gets regular movement with house/yard work and working in [employer].  She has sig family hx of CAD and has really been trying to lower her lipids naturally.  She denies CP/dyspnea/palpitations.   She continues to abstain from tobacco/ETOH.  Patient Care Team    Relationship Specialty Notifications Start End  [name] D, NP PCP - General Family Medicine  08/29/16   Huel Cote, NP Nurse Practitioner Obstetrics and Gynecology  08/29/16     Patient Active Problem List   Diagnosis Date Noted  . Elevated LDL cholesterol level 11/29/2016  . Screening for colon cancer  11/29/2016  . Breast nodule 11/29/2016  . Health care maintenance 08/29/2016  . Hyperlipidemia 08/29/2016  . Seasonal allergies   . Anxiety   . Benign colonic polyp      Past Medical History:  Diagnosis Date  . Anxiety    SITUATIONAL  . Hyperlipidemia   . Seasonal allergies      History reviewed. No pertinent surgical history.   Family History  Problem Relation Age of Onset  . Diabetes Father   . Hypertension Sister   . Heart disease Sister   . Heart murmur Sister   . Diabetes Brother      Social History   Substance and Sexual Activity  Drug Use No     Social History   Substance and Sexual Activity  Alcohol Use Yes   Comment: social     Social History   Tobacco Use  Smoking Status Never Smoker  Smokeless Tobacco Never Used     Outpatient Encounter Medications as of 11/29/2016  Medication Sig  . Calcium Carbonate Antacid (TUMS PO) Take by mouth as needed.  . cetirizine (ZYRTEC) 10 MG tablet 1/2 tab prn  . Cholecalciferol (VITAMIN D3) 2000 units TABS Take 2,000 Units daily by mouth.  . Ibuprofen (MIDOL PO) Take by mouth as needed.  . magnesium 30 MG tablet Take 30 mg daily by mouth.  . omega-3 acid ethyl esters (LOVAZA) 1 g capsule Take  1 g daily by mouth.  . pseudoephedrine (SUDAFED) 30 MG tablet Take 30 mg every 4 (four) hours as needed by mouth for congestion.  . valACYclovir (VALTREX) 500 MG tablet Take ONE TAB twice daily for 3-5 days as needed  . [DISCONTINUED] magnesium 30 MG tablet Take 30 mg by mouth 2 (two) times daily.  Marland Kitchen atorvastatin (LIPITOR) 10 MG tablet Take 1 tablet (10 mg total) daily by mouth.   No facility-administered encounter medications on file as of 11/29/2016.     Allergies: Patient has no known allergies.  Body mass index is 34.73 kg/m.  Blood pressure 119/80, pulse 81, height 5' 2.25" (1.581 m), weight 191 lb 6.4 oz (86.8 kg), last menstrual period 09/14/2012.     Review of Systems  Constitutional: Positive  for fatigue. Negative for activity change, appetite change, chills and diaphoresis.  HENT: Positive for congestion, postnasal drip and rhinorrhea. Negative for sinus pressure, sinus pain, sore throat, trouble swallowing and voice change.   Eyes: Negative for visual disturbance.  Respiratory: Negative for cough, chest tightness, shortness of breath, wheezing and stridor.   Cardiovascular: Negative for chest pain, palpitations and leg swelling.  Gastrointestinal: Negative for abdominal distention, abdominal pain, blood in stool, constipation, diarrhea, nausea and vomiting.  Endocrine: Negative for cold intolerance, heat intolerance, polydipsia, polyphagia and polyuria.  Genitourinary: Negative for difficulty urinating, flank pain and hematuria.  Musculoskeletal: Negative for arthralgias, back pain, gait problem, joint swelling, myalgias, neck pain and neck stiffness.  Skin: Negative for color change, pallor, rash and wound.  Neurological: Negative for dizziness and tremors.  Hematological: Does not bruise/bleed easily.  Psychiatric/Behavioral: Negative for dysphoric mood, self-injury, sleep disturbance and suicidal ideas.       Objective:   Physical Exam  Constitutional: She is oriented to person, place, and time. She appears well-developed and well-nourished.  HENT:  Head: Normocephalic and atraumatic.  Right Ear: Hearing, tympanic membrane, external ear and ear canal normal. Tympanic membrane is not perforated and not bulging. No decreased hearing is noted.  Left Ear: Hearing, tympanic membrane, external ear and ear canal normal. Tympanic membrane is not perforated and not bulging. No decreased hearing is noted.  Nose: Mucosal edema and rhinorrhea present. Right sinus exhibits no maxillary sinus tenderness and no frontal sinus tenderness. Left sinus exhibits no maxillary sinus tenderness and no frontal sinus tenderness.  Mouth/Throat: Uvula is midline, oropharynx is clear and moist and mucous  membranes are normal.  Eyes: Conjunctivae are normal. Pupils are equal, round, and reactive to light.  Neck: Normal range of motion. Neck supple.  Cardiovascular: Normal rate, regular rhythm and intact distal pulses.  No murmur heard. Pulmonary/Chest: Effort normal and breath sounds normal. No respiratory distress. She has no decreased breath sounds. She has no wheezes. She has no rhonchi. She has no rales. She exhibits no tenderness. Right breast exhibits no inverted nipple, no mass and no tenderness. Left breast exhibits mass. Left breast exhibits no inverted nipple and no tenderness.    One small, discrete, mobile, non-tender nodule noted. She has upcoming mammogram in the next few weeks- please talk to OB/GYN about nodule  Abdominal: Soft. Bowel sounds are normal. She exhibits no distension and no mass. There is no tenderness. There is no rebound, no guarding and no CVA tenderness.  Genitourinary: Rectal exam shows guaiac negative stool.  Genitourinary Comments: Will have PAP completed with OB/GYN External hemorrhoids noted Chaperone present during examination for rectal examination   Musculoskeletal: Normal range of motion. She exhibits no  edema or tenderness.  Lymphadenopathy:    She has no cervical adenopathy.  Neurological: She is alert and oriented to person, place, and time. Coordination normal.  Skin: Skin is warm and dry. No rash noted. No erythema. No pallor.  Psychiatric: She has a normal mood and affect. Her behavior is normal. Judgment and thought content normal.  Nursing note and vitals reviewed.          Assessment & Plan:   1. Need for influenza vaccination   2. Screening for colon cancer   3. Elevated LDL cholesterol level   4. On statin therapy   5. Health care maintenance   6. Mixed hyperlipidemia   7. Breast nodule     Health care maintenance Increase water intake, strive for at least 95 ounces/day.   Follow Heart Healthy diet Increase regular  exercise.  Recommend at least 30 minutes daily, 5 days per week of walking, jogging, biking, swimming, YouTube/Pinterest workout videos. OVERALL YOU ARE DOING GREAT! Please return in 6 months for regular follow-up  Hyperlipidemia 11/22/2016 Ref Range & Units 7d ago 32mo ago   Cholesterol, Total 100 - 199 mg/dL 300 Abnormally high     Triglycerides 0 - 149 mg/dL 179 Abnormally high   176 Abnormal  R  HDL >39 mg/dL 47  51 R  VLDL Cholesterol Cal 5 - 40 mg/dL 36    LDL Calculated 0 - 99 mg/dL 217 Abnormally high   258 R  Comment:  Comment    Comment: Possible Familial Hypercholesterolemia. FH should be suspected when  fasting LDL cholesterol is above 189 mg/dL or non-HDL cholesterol  is above 219 mg/dL. A family history of high cholesterol and heart  disease in 1st degree relatives should be collected. J Clin Lipidol  2011;5:133-140   Chol/HDL Ratio 0.0 - 4.4 ratio 6.4 Abnormally high        Please take Atorvastatin 10mg  nightly.   Return for liver function lab draw in 6 weeks and fasting lab draw in 4 months.  Breast nodule One small, discrete, mobile, non-tender nodule noted in L breast at 7 Oclock position from nipple. She has upcoming mammogram in the next few weeks- please talk to OB/GYN about nodule in left lower breast.    FOLLOW-UP:  Return in about 6 months (around 05/29/2017) for Regular Follow Up, Hypercholestermia.

## 2016-11-29 ENCOUNTER — Encounter: Payer: Self-pay | Admitting: Adult Health

## 2016-11-29 ENCOUNTER — Ambulatory Visit (INDEPENDENT_AMBULATORY_CARE_PROVIDER_SITE_OTHER): Payer: BLUE CROSS/BLUE SHIELD | Admitting: Adult Health

## 2016-11-29 VITALS — BP 119/80 | HR 81 | Ht 62.25 in | Wt 191.4 lb

## 2016-11-29 DIAGNOSIS — Z79899 Other long term (current) drug therapy: Secondary | ICD-10-CM

## 2016-11-29 DIAGNOSIS — Z0000 Encounter for general adult medical examination without abnormal findings: Secondary | ICD-10-CM

## 2016-11-29 DIAGNOSIS — Z1211 Encounter for screening for malignant neoplasm of colon: Secondary | ICD-10-CM | POA: Diagnosis not present

## 2016-11-29 DIAGNOSIS — N63 Unspecified lump in unspecified breast: Secondary | ICD-10-CM

## 2016-11-29 DIAGNOSIS — Z23 Encounter for immunization: Secondary | ICD-10-CM | POA: Diagnosis not present

## 2016-11-29 DIAGNOSIS — E7800 Pure hypercholesterolemia, unspecified: Secondary | ICD-10-CM | POA: Diagnosis not present

## 2016-11-29 DIAGNOSIS — E782 Mixed hyperlipidemia: Secondary | ICD-10-CM

## 2016-11-29 LAB — POC HEMOCCULT BLD/STL (OFFICE/1-CARD/DIAGNOSTIC)
FECAL OCCULT BLD: NEGATIVE
OCCULT BLOOD DATE: 1142018

## 2016-11-29 MED ORDER — ATORVASTATIN CALCIUM 10 MG PO TABS: 10.0000 mg | ORAL_TABLET | Freq: Every day | ORAL | 3 refills | Status: DC

## 2016-11-29 NOTE — Assessment & Plan Note (Signed)
One small, discrete, mobile, non-tender nodule noted in L breast at 7 Oclock position from nipple. She has upcoming mammogram in the next few weeks- please talk to OB/GYN about nodule in left lower breast.

## 2016-11-29 NOTE — Patient Instructions (Addendum)
Heart-Healthy Eating Plan Many factors influence your heart health, including eating and exercise habits. Heart (coronary) risk increases with abnormal blood fat (lipid) levels. Heart-healthy meal planning includes limiting unhealthy fats, increasing healthy fats, and making other small dietary changes. This includes maintaining a healthy body weight to help keep lipid levels within a normal range. What is my plan? Your health care provider recommends that you:  Get no more than _________% of the total calories in your daily diet from fat.  Limit your intake of saturated fat to less than _________% of your total calories each day.  Limit the amount of cholesterol in your diet to less than _________ mg per day.  What types of fat should I choose?  Choose healthy fats more often. Choose monounsaturated and polyunsaturated fats, such as olive oil and canola oil, flaxseeds, walnuts, almonds, and seeds.  Eat more omega-3 fats. Good choices include salmon, mackerel, sardines, tuna, flaxseed oil, and ground flaxseeds. Aim to eat fish at least two times each week.  Limit saturated fats. Saturated fats are primarily found in animal products, such as meats, butter, and cream. Plant sources of saturated fats include palm oil, palm kernel oil, and coconut oil.  Avoid foods with partially hydrogenated oils in them. These contain trans fats. Examples of foods that contain trans fats are stick margarine, some tub margarines, cookies, crackers, and other baked goods. What general guidelines do I need to follow?  Check food labels carefully to identify foods with trans fats or high amounts of saturated fat.  Fill one half of your plate with vegetables and green salads. Eat 4-5 servings of vegetables per day. A serving of vegetables equals 1 cup of raw leafy vegetables,  cup of raw or cooked cut-up vegetables, or  cup of vegetable juice.  Fill one fourth of your plate with whole grains. Look for the word  "whole" as the first word in the ingredient list.  Fill one fourth of your plate with lean protein foods.  Eat 4-5 servings of fruit per day. A serving of fruit equals one medium whole fruit,  cup of dried fruit,  cup of fresh, frozen, or canned fruit, or  cup of 100% fruit juice.  Eat more foods that contain soluble fiber. Examples of foods that contain this type of fiber are apples, broccoli, carrots, beans, peas, and barley. Aim to get 20-30 g of fiber per day.  Eat more home-cooked food and less restaurant, buffet, and fast food.  Limit or avoid alcohol.  Limit foods that are high in starch and sugar.  Avoid fried foods.  Cook foods by using methods other than frying. Baking, boiling, grilling, and broiling are all great options. Other fat-reducing suggestions include: ? Removing the skin from poultry. ? Removing all visible fats from meats. ? Skimming the fat off of stews, soups, and gravies before serving them. ? Steaming vegetables in water or broth.  Lose weight if you are overweight. Losing just 5-10% of your initial body weight can help your overall health and prevent diseases such as diabetes and heart disease.  Increase your consumption of nuts, legumes, and seeds to 4-5 servings per week. One serving of dried beans or legumes equals  cup after being cooked, one serving of nuts equals 1 ounces, and one serving of seeds equals  ounce or 1 tablespoon.  You may need to monitor your salt (sodium) intake, especially if you have high blood pressure. Talk with your health care provider or dietitian to get  more information about reducing sodium. What foods can I eat? Grains  Breads, including [place], white, pita, wheat, raisin, rye, oatmeal, and [place]. Tortillas that are neither fried nor made with lard or trans fat. Low-fat rolls, including hotdog and hamburger buns and English muffins. Biscuits. Muffins. Waffles. Pancakes. Light popcorn. Whole-grain cereals. Flatbread.  Melba toast. Pretzels. Breadsticks. Rusks. Low-fat snacks and crackers, including oyster, saltine, matzo, graham, animal, and rye. Rice and pasta, including brown rice and those that are made with whole wheat. Vegetables All vegetables. Fruits All fruits, but limit coconut. Meats and Other Protein Sources Lean, well-trimmed beef, veal, pork, and lamb. Chicken and [place] without skin. All fish and shellfish. Wild duck, rabbit, pheasant, and venison. Egg whites or low-cholesterol egg substitutes. Dried beans, peas, lentils, and tofu.Seeds and most nuts. Dairy Low-fat or nonfat cheeses, including ricotta, string, and mozzarella. Skim or 1% milk that is liquid, powdered, or evaporated. Buttermilk that is made with low-fat milk. Nonfat or low-fat yogurt. Beverages Mineral water. Diet carbonated beverages. Sweets and Desserts Sherbets and fruit ices. Honey, jam, marmalade, jelly, and syrups. Meringues and gelatins. Pure sugar candy, such as hard candy, jelly beans, gumdrops, mints, marshmallows, and small amounts of dark chocolate. [employer] Eat all sweets and desserts in moderation. Fats and Oils Nonhydrogenated (trans-free) margarines. Vegetable oils, including soybean, sesame, sunflower, olive, peanut, safflower, corn, canola, and cottonseed. Salad dressings or mayonnaise that are made with a vegetable oil. Limit added fats and oils that you use for cooking, baking, salads, and as spreads. Other Cocoa powder. Coffee and tea. All seasonings and condiments. The items listed above may not be a complete list of recommended foods or beverages. Contact your dietitian for more options. What foods are not recommended? Grains Breads that are made with saturated or trans fats, oils, or whole milk. Croissants. Butter rolls. Cheese breads. Sweet rolls. Donuts. Buttered popcorn. Chow mein noodles. High-fat crackers, such as cheese or butter crackers. Meats and Other Protein Sources Fatty meats, such  as hotdogs, short ribs, sausage, spareribs, bacon, ribeye roast or steak, and mutton. High-fat deli meats, such as salami and bologna. Caviar. Domestic duck and goose. Organ meats, such as kidney, liver, sweetbreads, brains, gizzard, chitterlings, and heart. Dairy Cream, sour cream, cream cheese, and creamed cottage cheese. Whole milk cheeses, including blue (bleu), [place], [place], [place], [place], [place], [place], [place], [place], and [place]. Whole or 2% milk that is liquid, evaporated, or condensed. Whole buttermilk. Cream sauce or high-fat cheese sauce. Yogurt that is made from whole milk. Beverages Regular sodas and drinks with added sugar. Sweets and Desserts Frosting. Pudding. Cookies. Cakes other than angel food cake. Candy that has milk chocolate or white chocolate, hydrogenated fat, butter, coconut, or unknown ingredients. Buttered syrups. Full-fat ice cream or ice cream drinks. Fats and Oils Gravy that has suet, meat fat, or shortening. Cocoa butter, hydrogenated oils, palm oil, coconut oil, palm kernel oil. These can often be found in baked products, candy, fried foods, nondairy creamers, and whipped toppings. Solid fats and shortenings, including bacon fat, salt pork, lard, and butter. Nondairy cream substitutes, such as coffee creamers and sour cream substitutes. Salad dressings that are made of unknown oils, cheese, or sour cream. The items listed above may not be a complete list of foods and beverages to avoid. Contact your dietitian for more information. This information is not intended to replace advice given to you by your health care provider. Make sure you discuss any questions you have with your health care  provider. Document Released: 10/12/2007 Document Revised: 07/23/2015 Document Reviewed: 06/26/2013 Elsevier Interactive Patient Education  2017 [place].  Atorvastatin tablets What is this medicine? ATORVASTATIN (a TORE va sta tin) is known as a HMG-CoA reductase  inhibitor or 'statin'. It lowers the level of cholesterol and triglycerides in the blood. This drug may also reduce the risk of heart attack, stroke, or other health problems in patients with risk factors for heart disease. Diet and lifestyle changes are often used with this drug. This medicine may be used for other purposes; ask your health care provider or pharmacist if you have questions. COMMON BRAND NAME(S): Lipitor What should I tell my health care provider before I take this medicine? They need to know if you have any of these conditions: -frequently drink alcoholic beverages -history of stroke, TIA -kidney disease -liver disease -muscle aches or weakness -other medical condition -an unusual or allergic reaction to atorvastatin, other medicines, foods, dyes, or preservatives -pregnant or trying to get pregnant -breast-feeding How should I use this medicine? Take this medicine by mouth with a glass of water. Follow the directions on the prescription label. You can take this medicine with or without food. Take your doses at regular intervals. Do not take your medicine more often than directed. Talk to your pediatrician regarding the use of this medicine in children. While this drug may be prescribed for children as young as 16 years old for selected conditions, precautions do apply. Overdosage: If you think you have taken too much of this medicine contact a poison control center or emergency room at once. NOTE: This medicine is only for you. Do not share this medicine with others. What if I miss a dose? If you miss a dose, take it as soon as you can. If it is almost time for your next dose, take only that dose. Do not take double or extra doses. What may interact with this medicine? Do not take this medicine with any of the following medications: -red yeast rice -telaprevir -telithromycin -voriconazole This medicine may also interact with the following  medications: -alcohol -antiviral medicines for HIV or AIDS -boceprevir -certain antibiotics like clarithromycin, erythromycin, troleandomycin -certain medicines for cholesterol like fenofibrate or gemfibrozil -cimetidine -clarithromycin -colchicine -cyclosporine -digoxin -female hormones, like estrogens or progestins and birth control pills -grapefruit juice -medicines for fungal infections like fluconazole, itraconazole, ketoconazole -niacin -rifampin -spironolactone This list may not describe all possible interactions. Give your health care provider a list of all the medicines, herbs, non-prescription drugs, or dietary supplements you use. Also tell them if you smoke, drink alcohol, or use illegal drugs. Some items may interact with your medicine. What should I watch for while using this medicine? Visit your doctor or health care professional for regular check-ups. You may need regular tests to make sure your liver is working properly. Tell your doctor or health care professional right away if you get any unexplained muscle pain, tenderness, or weakness, especially if you also have a fever and tiredness. Your doctor or health care professional may tell you to stop taking this medicine if you develop muscle problems. If your muscle problems do not go away after stopping this medicine, contact your health care professional. This drug is only part of a total heart-health program. Your doctor or a dietician can suggest a low-cholesterol and low-fat diet to help. Avoid alcohol and smoking, and keep a proper exercise schedule. Do not use this drug if you are pregnant or breast-feeding. Serious side effects to an unborn  child or to an infant are possible. Talk to your doctor or pharmacist for more information. This medicine may affect blood sugar levels. If you have diabetes, check with your doctor or health care professional before you change your diet or the dose of your diabetic medicine. If  you are going to have surgery tell your health care professional that you are taking this drug. What side effects may I notice from receiving this medicine? Side effects that you should report to your doctor or health care professional as soon as possible: -allergic reactions like skin rash, itching or hives, swelling of the face, lips, or tongue -dark urine -fever -joint pain -muscle cramps, pain -redness, blistering, peeling or loosening of the skin, including inside the mouth -trouble passing urine or change in the amount of urine -unusually weak or tired -yellowing of eyes or skin Side effects that usually do not require medical attention (report to your doctor or health care professional if they continue or are bothersome): -constipation -heartburn -stomach gas, pain, upset This list may not describe all possible side effects. Call your doctor for medical advice about side effects. You may report side effects to FDA at 1-800-FDA-1088. Where should I keep my medicine? Keep out of the reach of children. Store at room temperature between 20 to 25 degrees C (68 to 77 degrees F). Throw away any unused medicine after the expiration date. NOTE: This sheet is a summary. It may not cover all possible information. If you have questions about this medicine, talk to your doctor, pharmacist, or health care provider.  2018 Elsevier/Gold Standard (2010-11-22 44:92:01)  Please take Atorvastatin 10mg  nightly.   Return for liver function lab draw in 6 weeks and fasting lab draw in 4 months. Increase water intake, strive for at least 95 ounces/day.   Follow Heart Healthy diet Increase regular exercise.  Recommend at least 30 minutes daily, 5 days per week of walking, jogging, biking, swimming, YouTube/Pinterest workout videos. She has upcoming mammogram in the next few weeks- please talk to OB/GYN about nodule in left lower breast. OVERALL YOU ARE DOING GREAT! Please return in 6 months for regular  follow-up NICE TO SEE YOU!

## 2016-11-29 NOTE — Assessment & Plan Note (Signed)
Increase water intake, strive for at least 95 ounces/day.   Follow Heart Healthy diet Increase regular exercise.  Recommend at least 30 minutes daily, 5 days per week of walking, jogging, biking, swimming, YouTube/Pinterest workout videos. OVERALL YOU ARE DOING GREAT! Please return in 6 months for regular follow-up

## 2016-11-29 NOTE — Assessment & Plan Note (Signed)
11/22/2016 Ref Range & Units 7d ago 68mo ago   Cholesterol, Total 100 - 199 mg/dL 300 Abnormally high     Triglycerides 0 - 149 mg/dL 179 Abnormally high   176 Abnormal  R  HDL >39 mg/dL 47  51 R  VLDL Cholesterol Cal 5 - 40 mg/dL 36    LDL Calculated 0 - 99 mg/dL 217 Abnormally high   258 R  Comment:  Comment    Comment: Possible Familial Hypercholesterolemia. FH should be suspected when  fasting LDL cholesterol is above 189 mg/dL or non-HDL cholesterol  is above 219 mg/dL. A family history of high cholesterol and heart  disease in 1st degree relatives should be collected. J Clin Lipidol  2011;5:133-140   Chol/HDL Ratio 0.0 - 4.4 ratio 6.4 Abnormally high        Please take Atorvastatin 10mg  nightly.   Return for liver function lab draw in 6 weeks and fasting lab draw in 4 months.

## 2016-12-11 ENCOUNTER — Other Ambulatory Visit (INDEPENDENT_AMBULATORY_CARE_PROVIDER_SITE_OTHER): Payer: BLUE CROSS/BLUE SHIELD

## 2016-12-11 DIAGNOSIS — Z1211 Encounter for screening for malignant neoplasm of colon: Secondary | ICD-10-CM

## 2016-12-11 LAB — IFOBT (OCCULT BLOOD)
IFOBT: NEGATIVE
IFOBT: NEGATIVE
IFOBT: NEGATIVE

## 2017-01-12 ENCOUNTER — Other Ambulatory Visit: Payer: BLUE CROSS/BLUE SHIELD

## 2017-01-18 ENCOUNTER — Other Ambulatory Visit: Payer: BLUE CROSS/BLUE SHIELD

## 2017-01-18 DIAGNOSIS — E7800 Pure hypercholesterolemia, unspecified: Secondary | ICD-10-CM

## 2017-01-19 LAB — ALT: ALT: 24 IU/L (ref 0–32)

## 2017-03-19 NOTE — Progress Notes (Signed)
Subjective:    Patient ID: Veronica Leonard, female    DOB: Aug 22, 1955, 62 y.o.   MRN: [id number]  HPI :  Veronica Leonard presents with copious clear nasal drainage, productive cough (thick/clear mucus), facial pressure, and "clogged ears" that all started >10 days ago and has been steadily worsening.  She has hx of recurrent sinusitis and was recently seen by ENT that recommended sinus surgery. She has been using OTC "Cold Remedies" with only minor sx relief. She denies tobacco/ETOH use and has been drinking "a lot of hot tea", and estimates to drink 2-3 16 oz bottles water/day.  Patient Care Team    Relationship Specialty Notifications Start End  [name] D, NP PCP - General Family Medicine  08/29/16   Huel Cote, NP Nurse Practitioner Obstetrics and Gynecology  08/29/16     Patient Active Problem List   Diagnosis Date Noted  . Elevated LDL cholesterol level 11/29/2016  . Screening for colon cancer 11/29/2016  . Breast nodule 11/29/2016  . Health care maintenance 08/29/2016  . Hyperlipidemia 08/29/2016  . Seasonal allergies   . Anxiety   . Benign colonic polyp      Past Medical History:  Diagnosis Date  . Anxiety    SITUATIONAL  . Hyperlipidemia   . Seasonal allergies      History reviewed. No pertinent surgical history.   Family History  Problem Relation Age of Onset  . Diabetes Father   . Hypertension Sister   . Heart disease Sister   . Heart murmur Sister   . Diabetes Brother      Social History   Substance and Sexual Activity  Drug Use No     Social History   Substance and Sexual Activity  Alcohol Use Yes   Comment: social     Social History   Tobacco Use  Smoking Status Never Smoker  Smokeless Tobacco Never Used     Outpatient Encounter Medications as of 03/20/2017  Medication Sig  . atorvastatin (LIPITOR) 10 MG tablet Take 1 tablet (10 mg total) daily by mouth.  . Calcium Carbonate Antacid (TUMS PO) Take by mouth as needed.  .  cetirizine (ZYRTEC) 10 MG tablet 1/2 tab prn  . Cholecalciferol (VITAMIN D3) 2000 units TABS Take 2,000 Units daily by mouth.  . Ibuprofen (MIDOL PO) Take by mouth as needed.  . magnesium 30 MG tablet Take 30 mg daily by mouth.  . omega-3 acid ethyl esters (LOVAZA) 1 g capsule Take 1 g daily by mouth.  . pseudoephedrine (SUDAFED) 30 MG tablet Take 30 mg every 4 (four) hours as needed by mouth for congestion.  . valACYclovir (VALTREX) 500 MG tablet Take ONE TAB twice daily for 3-5 days as needed   No facility-administered encounter medications on file as of 03/20/2017.     Allergies: Patient has no known allergies.  Body mass index is 34.98 kg/m.  Blood pressure 135/80, pulse 78, temperature 98.1 F (36.7 C), temperature source Oral, height 5' 2.25" (1.581 m), weight 192 lb 12.8 oz (87.5 kg), last menstrual period 09/14/2012, SpO2 98 %.    Review of Systems  Constitutional: Positive for fatigue. Negative for activity change, appetite change, chills, diaphoresis, fever and unexpected weight change.  HENT: Positive for congestion, postnasal drip and sinus pressure. Negative for sinus pain and sore throat.   Eyes: Negative for visual disturbance.  Respiratory: Positive for cough. Negative for chest tightness, shortness of breath, wheezing and stridor.   Cardiovascular: Negative for chest pain,  palpitations and leg swelling.  Gastrointestinal: Negative for abdominal pain, constipation, diarrhea and vomiting.  Neurological: Positive for headaches. Negative for dizziness.  Psychiatric/Behavioral: Positive for sleep disturbance.       Objective:   Physical Exam  Constitutional: She is oriented to person, place, and time. She appears well-developed and well-nourished. No distress.  HENT:  Head: Normocephalic and atraumatic.  Right Ear: Hearing, external ear and ear canal normal. Tympanic membrane is erythematous and bulging. No decreased hearing is noted.  Left Ear: External ear and ear  canal normal. Tympanic membrane is erythematous and bulging. No decreased hearing is noted.  Nose: Mucosal edema and rhinorrhea present. Right sinus exhibits maxillary sinus tenderness. Right sinus exhibits no frontal sinus tenderness. Left sinus exhibits maxillary sinus tenderness. Left sinus exhibits no frontal sinus tenderness.  Mouth/Throat: Uvula is midline and mucous membranes are normal. Posterior oropharyngeal edema and posterior oropharyngeal erythema present. No oropharyngeal exudate or tonsillar abscesses.  Neck: Normal range of motion. Neck supple.  Cardiovascular: Normal rate, regular rhythm, normal heart sounds and intact distal pulses.  No murmur heard. Pulmonary/Chest: Effort normal and breath sounds normal. No respiratory distress. She has no wheezes. She has no rales. She exhibits no tenderness.  Lymphadenopathy:    She has cervical adenopathy.  Neurological: She is alert and oriented to person, place, and time.  Skin: Skin is warm and dry. No rash noted. She is not diaphoretic. No erythema. No pallor.  Psychiatric: She has a normal mood and affect. Her behavior is normal. Judgment and thought content normal.  Nursing note and vitals reviewed.         Assessment & Plan:   1. Acute recurrent maxillary sinusitis   2. Excessive cerumen in both ear canals   3. Elevated LDL cholesterol level     Excessive cerumen in both ear canals Ears flushed  Acute recurrent maxillary sinusitis Augmentin [place] Controlled Substance Database reviewed- no contraindications for tussionex. Increase fluids/rest/vit-c 2,000mg /day Follow-up with ENT  Elevated LDL cholesterol level Needs to return for fasting labs in the next week Last ALT check WNL- Jan 2019    FOLLOW-UP:  Return if symptoms worsen or fail to improve.

## 2017-03-20 ENCOUNTER — Ambulatory Visit: Payer: BLUE CROSS/BLUE SHIELD | Admitting: Adult Health

## 2017-03-20 ENCOUNTER — Telehealth: Payer: Self-pay | Admitting: Adult Health

## 2017-03-20 ENCOUNTER — Encounter: Payer: Self-pay | Admitting: Adult Health

## 2017-03-20 VITALS — BP 135/80 | HR 78 | Temp 98.1°F | Ht 62.25 in | Wt 192.8 lb

## 2017-03-20 DIAGNOSIS — J0101 Acute recurrent maxillary sinusitis: Secondary | ICD-10-CM | POA: Insufficient documentation

## 2017-03-20 DIAGNOSIS — E7800 Pure hypercholesterolemia, unspecified: Secondary | ICD-10-CM | POA: Diagnosis not present

## 2017-03-20 DIAGNOSIS — H6123 Impacted cerumen, bilateral: Secondary | ICD-10-CM

## 2017-03-20 DIAGNOSIS — H6122 Impacted cerumen, left ear: Secondary | ICD-10-CM | POA: Insufficient documentation

## 2017-03-20 MED ORDER — HYDROCOD POLST-CPM POLST ER 10-8 MG/5ML PO SUER: 5.0000 mL | Freq: Two times a day (BID) | ORAL | 0 refills | Status: DC | PRN

## 2017-03-20 MED ORDER — AMOXICILLIN-POT CLAVULANATE 875-125 MG PO TABS: 1.0000 | ORAL_TABLET | Freq: Two times a day (BID) | ORAL | 0 refills | Status: DC

## 2017-03-20 NOTE — Assessment & Plan Note (Signed)
Augmentin [facility] Controlled Substance Database reviewed- no contraindications for tussionex. Increase fluids/rest/vit-c 2,000mg /day Follow-up with ENT

## 2017-03-20 NOTE — Patient Instructions (Signed)

## 2017-03-20 NOTE — Telephone Encounter (Signed)
Pt called states [facility] [address] is (OUT of  The Rx  chlorpheniramine-HYDROcodone ([place]) 10-8 MG/5ML SUER [[id number]]  Order Details  Dose: 5 mL Route: Oral Frequency: Every 12 hours PRN for cough  Dispense Quantity: 140 mL Refills: 0 Fills remaining: --        Sig: Take 5 mLs by mouth every 12 (twelve) hours as needed for cough.      Pt request Rx be transfer to a DIFFERENT Pharmacy---  Preferred [facility] [address] [place] [phone] (Phone) [phone] (Fax   --glh

## 2017-03-20 NOTE — Assessment & Plan Note (Signed)
>>  ASSESSMENT AND PLAN FOR ELEVATED LDL CHOLESTEROL LEVEL WRITTEN ON 03/20/2017  9:28 AM BY DANFORD, KATY D, NP  Needs to return for fasting labs in the next week Last ALT check WNL- Jan 2019

## 2017-03-20 NOTE — Assessment & Plan Note (Signed)
Needs to return for fasting labs in the next week Last ALT check WNL- Jan 2019

## 2017-03-20 NOTE — Assessment & Plan Note (Signed)
Ears flushed

## 2017-03-21 ENCOUNTER — Other Ambulatory Visit: Payer: Self-pay | Admitting: Adult Health

## 2017-03-21 MED ORDER — HYDROCOD POLST-CPM POLST ER 10-8 MG/5ML PO SUER: 5.0000 mL | Freq: Two times a day (BID) | ORAL | 0 refills | Status: DC | PRN

## 2017-03-21 NOTE — Telephone Encounter (Signed)
Attempted to contact pt but VM box was full and unable to accept messages.  Charyl Bigger, CMA

## 2017-03-21 NOTE — Telephone Encounter (Signed)
Good Morning Tonya, Can you please call Ms. Iversen and tell her I re-sent Rx to [facility] - [place] Thanks! Valetta Fuller

## 2017-03-21 NOTE — Telephone Encounter (Signed)
Please review for appropriateness.  Charyl Bigger, CMA

## 2017-04-09 ENCOUNTER — Other Ambulatory Visit: Payer: Self-pay | Admitting: Gynecology

## 2017-04-09 DIAGNOSIS — Z1231 Encounter for screening mammogram for malignant neoplasm of breast: Secondary | ICD-10-CM

## 2017-04-10 ENCOUNTER — Ambulatory Visit
Admission: RE | Admit: 2017-04-10 | Discharge: 2017-04-10 | Disposition: A | Discharge status: Home / Self Care | Payer: BLUE CROSS/BLUE SHIELD | Source: Ambulatory Visit | Attending: Gynecology | Admitting: Gynecology

## 2017-04-10 DIAGNOSIS — Z1231 Encounter for screening mammogram for malignant neoplasm of breast: Secondary | ICD-10-CM

## 2017-04-13 ENCOUNTER — Other Ambulatory Visit: Payer: BLUE CROSS/BLUE SHIELD

## 2017-04-13 DIAGNOSIS — Z79899 Other long term (current) drug therapy: Secondary | ICD-10-CM

## 2017-04-14 LAB — LIPID PANEL
CHOL/HDL RATIO: 4.6 ratio — AB (ref 0.0–4.4)
Cholesterol, Total: 211 mg/dL — ABNORMAL HIGH (ref 100–199)
HDL: 46 mg/dL (ref >39)
LDL Calculated: 130 mg/dL — ABNORMAL HIGH (ref 0–99)
TRIGLYCERIDES: 176 mg/dL — AB (ref 0–149)
VLDL CHOLESTEROL CAL: 35 mg/dL (ref 5–40)

## 2017-05-02 NOTE — Progress Notes (Signed)
Received Epic notification that pt has not read MyChart message regarding results.    Recent lab results   From Fonnie Mu, CMA To Leonie Green "Veronica Leonard" Sent 04/18/2017 1:48 PM  Dear Ms. Lamount Cohen asked that I share that your  The 10-year ASCVD (acute cardiovascular event such as a stroke or heart attack) risk score is: 3.9%  Your HDL (good Cholesterol) was 46 mg/dL  Total Cholesterol was 211 mg/dL  LDL (bad cholesterol) was 130  Overall excellent improvement in lipid panel- continue to follow heart healthy diet and move as often as possible.  Continue all medications as directed.   Wishing you well,  Kenney Houseman, CMA for  [name], NP   [address]   MyChart User Last Read On  Veronica Leonard "Veronica Leonard" Not Read     Letter mailed to pt.  Charyl Bigger, CMA

## 2017-05-14 ENCOUNTER — Encounter: Payer: Self-pay | Admitting: Adult Health

## 2017-05-14 ENCOUNTER — Ambulatory Visit: Payer: BLUE CROSS/BLUE SHIELD | Admitting: Adult Health

## 2017-05-14 VITALS — BP 123/86 | HR 82 | Ht 62.25 in | Wt 195.5 lb

## 2017-05-14 DIAGNOSIS — L259 Unspecified contact dermatitis, unspecified cause: Secondary | ICD-10-CM | POA: Insufficient documentation

## 2017-05-14 DIAGNOSIS — L299 Pruritus, unspecified: Secondary | ICD-10-CM | POA: Diagnosis not present

## 2017-05-14 MED ORDER — PREDNISONE 10 MG (21) PO TBPK: ORAL_TABLET | ORAL | 0 refills | Status: DC

## 2017-05-14 NOTE — Progress Notes (Signed)
Subjective:    Patient ID: Veronica Leonard, female    DOB: June 25, 1955, 62 y.o.   MRN: [id number]  HPI:  Ms. Leonard presents with redness and itching on hands, chest, neck, and abdomen. She reports eating shrimp over Easter and came into contact with tomato skins last Tuesday, while cooking/canning. She reports sx's first developed on bil hands on Tuesday, then have been steadily spreading to other areas of body. She denies wheezing/change in voice/difficulty swallowing/breathing. She has been using OTC Zyrtec, OTC Benadryl, OTC Hydrocortisone cream, and "Baking Powder" baths with only minimal sx relief. She denies cough/N/V/D  Patient Care Team    Relationship Specialty Notifications Start End  Veronica Marble D, NP PCP - General Family Medicine  08/29/16   Huel Cote, NP Nurse Practitioner Obstetrics and Gynecology  08/29/16     Patient Active Problem List   Diagnosis Date Noted  . Excessive cerumen in both ear canals 03/20/2017  . Acute recurrent maxillary sinusitis 03/20/2017  . Elevated LDL cholesterol level 11/29/2016  . Screening for colon cancer 11/29/2016  . Breast nodule 11/29/2016  . Health care maintenance 08/29/2016  . Hyperlipidemia 08/29/2016  . Seasonal allergies   . Anxiety   . Benign colonic polyp      Past Medical History:  Diagnosis Date  . Anxiety    SITUATIONAL  . Hyperlipidemia   . Seasonal allergies      History reviewed. No pertinent surgical history.   Family History  Problem Relation Age of Onset  . Diabetes Father   . Hypertension Sister   . Heart disease Sister   . Heart murmur Sister   . Diabetes Brother      Social History   Substance and Sexual Activity  Drug Use No     Social History   Substance and Sexual Activity  Alcohol Use Yes   Comment: social     Social History   Tobacco Use  Smoking Status Never Smoker  Smokeless Tobacco Never Used     Outpatient Encounter Medications as of 05/14/2017  Medication  Sig  . atorvastatin (LIPITOR) 10 MG tablet Take 1 tablet (10 mg total) daily by mouth.  . Calcium Carbonate Antacid (TUMS PO) Take by mouth as needed.  . cetirizine (ZYRTEC) 10 MG tablet 1/2 tab prn  . Cholecalciferol (VITAMIN D3) 2000 units TABS Take 2,000 Units daily by mouth.  . Ibuprofen (MIDOL PO) Take by mouth as needed.  . magnesium 30 MG tablet Take 30 mg daily by mouth.  . omega-3 acid ethyl esters (LOVAZA) 1 g capsule Take 1 g daily by mouth.  . pseudoephedrine (SUDAFED) 30 MG tablet Take 30 mg every 4 (four) hours as needed by mouth for congestion.  . valACYclovir (VALTREX) 500 MG tablet Take ONE TAB twice daily for 3-5 days as needed  . [DISCONTINUED] amoxicillin-clavulanate (AUGMENTIN) 875-125 MG tablet Take 1 tablet by mouth 2 (two) times daily.  . [DISCONTINUED] chlorpheniramine-HYDROcodone (TUSSIONEX) 10-8 MG/5ML SUER Take 5 mLs by mouth every 12 (twelve) hours as needed for cough.   No facility-administered encounter medications on file as of 05/14/2017.     Allergies: Patient has no known allergies.  Body mass index is 35.47 kg/m.  Blood pressure 123/86, pulse 82, height 5' 2.25" (1.581 m), weight 195 lb 8 oz (88.7 kg), last menstrual period 09/14/2012, SpO2 98 %.     Review of Systems  Constitutional: Negative for activity change, appetite change, chills, diaphoresis, fatigue, fever and unexpected weight change.  HENT:  Negative for congestion.   Eyes: Negative for visual disturbance.  Respiratory: Negative for cough, chest tightness, shortness of breath, wheezing and stridor.   Cardiovascular: Negative for chest pain, palpitations and leg swelling.  Gastrointestinal: Negative for abdominal distention, abdominal pain, blood in stool, constipation, diarrhea, nausea and vomiting.  Endocrine: Negative for cold intolerance, heat intolerance, polydipsia, polyphagia and polyuria.  Skin: Positive for color change and rash. Negative for pallor and wound.   Allergic/Immunologic: Positive for environmental allergies. Negative for food allergies.  Neurological: Negative for dizziness and headaches.  Hematological: Does not bruise/bleed easily.  Psychiatric/Behavioral: Negative for hallucinations, self-injury, sleep disturbance and suicidal ideas. The patient is not nervous/anxious and is not hyperactive.        Objective:   Physical Exam  Constitutional: She is oriented to person, place, and time. She appears well-developed and well-nourished. No distress.  HENT:  Head: Normocephalic and atraumatic.  Eyes: EOM are normal.  Neck: Normal range of motion. Neck supple.  Cardiovascular: Normal rate, regular rhythm, normal heart sounds and intact distal pulses.  No murmur heard. Pulmonary/Chest: Effort normal and breath sounds normal. No stridor. No respiratory distress. She has no wheezes. She has no rales. She exhibits no tenderness.  Lymphadenopathy:    She has no cervical adenopathy.  Neurological: She is alert and oriented to person, place, and time.  Skin: Skin is warm and dry. Rash noted. She is not diaphoretic. There is erythema.  Erythema, dry skin noted on bil hands, anterior chest, and lower abdomen. No open tissue/drainage/srteaking/excessive warmth noted  Psychiatric: She has a normal mood and affect. Her behavior is normal. Judgment and thought content normal.      Assessment & Plan:   1. Pruritus of skin   2. Contact dermatitis, unspecified contact dermatitis type, unspecified trigger     Pruritus of skin Pred dose pak per box instructions Continue daily cetirizine and diphenhydramine per manufacturer instructions. Remain well hydrated, keep skin lubricated. Continue OTC hydrocortisone cream per manufacturer instructions. If symptoms persist after prednisone pak completed, please call clinic  Contact dermatitis Pred dose pak per box instructions Continue daily cetirizine and diphenhydramine per manufacturer  instructions. Remain well hydrated, keep skin lubricated. Continue OTC hydrocortisone cream per manufacturer instructions. If symptoms persist after prednisone pak completed, please call clinic    FOLLOW-UP:  Return if symptoms worsen or fail to improve.

## 2017-05-14 NOTE — Assessment & Plan Note (Signed)
Pred dose pak per box instructions Continue daily cetirizine and diphenhydramine per manufacturer instructions. Remain well hydrated, keep skin lubricated. Continue OTC hydrocortisone cream per manufacturer instructions. If symptoms persist after prednisone pak completed, please call clinic

## 2017-05-14 NOTE — Patient Instructions (Signed)
Contact Dermatitis Dermatitis is redness, soreness, and swelling (inflammation) of the skin. Contact dermatitis is a reaction to certain substances that touch the skin. You either touched something that irritated your skin, or you have allergies to something you touched. Follow these instructions at home: [place] your skin as needed.  Apply cool compresses to the affected areas.  Try taking a bath with: ? Epsom salts. Follow the instructions on the package. You can get these at a pharmacy or grocery store. ? Baking soda. Pour a small amount into the bath as told by your doctor. ? Colloidal oatmeal. Follow the instructions on the package. You can get this at a pharmacy or grocery store.  Try applying baking soda paste to your skin. Stir water into baking soda until it looks like paste.  Do not scratch your skin.  Bathe less often.  Bathe in lukewarm water. Avoid using hot water. Medicines  Take or apply over-the-counter and prescription medicines only as told by your doctor.  If you were prescribed an antibiotic medicine, take or apply your antibiotic as told by your doctor. Do not stop taking the antibiotic even if your condition starts to get better. General instructions  Keep all follow-up visits as told by your doctor. This is important.  Avoid the substance that caused your reaction. If you do not know what caused it, keep a journal to try to track what caused it. Write down: ? What you eat. ? What cosmetic products you use. ? What you drink. ? What you wear in the affected area. This includes jewelry.  If you were given a bandage (dressing), take care of it as told by your doctor. This includes when to change and remove it. Contact a doctor if:  You do not get better with treatment.  Your condition gets worse.  You have signs of infection such as: ? Swelling. ? Tenderness. ? Redness. ? Soreness. ? Warmth.  You have a fever.  You have new  symptoms. Get help right away if:  You have a very bad headache.  You have neck pain.  Your neck is stiff.  You throw up (vomit).  You feel very sleepy.  You see red streaks coming from the affected area.  Your bone or joint underneath the affected area becomes painful after the skin has healed.  The affected area turns darker.  You have trouble breathing. This information is not intended to replace advice given to you by your health care provider. Make sure you discuss any questions you have with your health care provider. Document Released: 10/30/2008 Document Revised: 06/10/2015 Document Reviewed: 05/20/2014 Elsevier Interactive Patient Education  2018 [employer].  Pred dose pak per box instructions Continue daily cetirizine and diphenhydramine per manufacturer instructions. Remain well hydrated, keep skin lubricated. Continue OTC hydrocortisone cream per manufacturer instructions. If symptoms persist after prednisone pak completed, please call clinic

## 2017-05-29 ENCOUNTER — Ambulatory Visit: Payer: BLUE CROSS/BLUE SHIELD | Admitting: Adult Health

## 2017-05-29 ENCOUNTER — Encounter: Payer: Self-pay | Admitting: Adult Health

## 2017-05-29 VITALS — BP 132/79 | HR 81 | Ht 62.25 in | Wt 198.5 lb

## 2017-05-29 DIAGNOSIS — L299 Pruritus, unspecified: Secondary | ICD-10-CM | POA: Diagnosis not present

## 2017-05-29 DIAGNOSIS — E785 Hyperlipidemia, unspecified: Secondary | ICD-10-CM | POA: Diagnosis not present

## 2017-05-29 DIAGNOSIS — Z0000 Encounter for general adult medical examination without abnormal findings: Secondary | ICD-10-CM

## 2017-05-29 MED ORDER — HYDROXYZINE HCL 25 MG PO TABS: 25.0000 mg | ORAL_TABLET | Freq: Every day | ORAL | 0 refills | Status: DC

## 2017-05-29 NOTE — Patient Instructions (Addendum)
Dyslipidemia Dyslipidemia is an imbalance of waxy, fat-like substances (lipids) in the blood. The body needs lipids in small amounts. Dyslipidemia often involves a high level of cholesterol or triglycerides, which are types of lipids. Common forms of dyslipidemia include:  High levels of bad cholesterol (LDL cholesterol). LDL is the type of cholesterol that causes fatty deposits (plaques) to build up in the blood vessels that carry blood away from your heart (arteries).  Low levels of good cholesterol (HDL cholesterol). HDL cholesterol is the type of cholesterol that protects against heart disease. High levels of HDL remove the LDL buildup from arteries.  High levels of triglycerides. Triglycerides are a fatty substance in the blood that is linked to a buildup of plaques in the arteries.  You can develop dyslipidemia because of the genes you are born with (primary dyslipidemia) or changes that occur during your life (secondary dyslipidemia), or as a side effect of certain medical treatments. What are the causes? Primary dyslipidemia is caused by changes (mutations) in genes that are passed down through families (inherited). These mutations cause several types of dyslipidemia. Mutations can result in disorders that make the body produce too much LDL cholesterol or triglycerides, or not enough HDL cholesterol. These disorders may lead to heart disease, arterial disease, or stroke at an early age. Causes of secondary dyslipidemia include certain lifestyle choices and diseases that lead to dyslipidemia, such as:  Eating a diet that is high in animal fat.  Not getting enough activity or exercise (having a sedentary lifestyle).  Having diabetes, kidney disease, liver disease, or thyroid disease.  Drinking large amounts of alcohol.  Using certain types of drugs.  What increases the risk? You may be at greater risk for dyslipidemia if you are an older man or if you are a woman who has gone through  menopause. Other risk factors include:  Having a family history of dyslipidemia.  Taking certain medicines, including birth control pills, steroids, some diuretics, beta-blockers, and some medicines forHIV.  Smoking cigarettes.  Eating a high-fat diet.  Drinking large amounts of alcohol.  Having certain medical conditions such as diabetes, polycystic ovary syndrome (PCOS), pregnancy, kidney disease, liver disease, or hypothyroidism.  Not exercising regularly.  Being overweight or obese with too much belly fat.  What are the signs or symptoms? Dyslipidemia does not usually cause any symptoms. Very high lipid levels can cause fatty bumps under the skin (xanthomas) or a white or gray ring around the black center (pupil) of the eye. Very high triglyceride levels can cause inflammation of the pancreas (pancreatitis). How is this diagnosed? Your health care provider may diagnose dyslipidemia based on a routine blood test (fasting blood test). Because most people do not have symptoms of the condition, this blood testing (lipid profile) is done on adults age 40 and older and is repeated every 5 years. This test checks:  Total cholesterol. This is a measure of the total amount of cholesterol in your blood, including LDL cholesterol, HDL cholesterol, and triglycerides. A healthy number is below 200.  LDL cholesterol. The target number for LDL cholesterol is different for each person, depending on individual risk factors. For most people, a number below 100 is healthy. Ask your health care provider what your LDL cholesterol number should be.  HDL cholesterol. An HDL level of 60 or higher is best because it helps to protect against heart disease. A number below 42 for men or below 39 for women increases the risk for heart disease.  Triglycerides. A  healthy triglyceride number is below 150.  If your lipid profile is abnormal, your health care provider may do other blood tests to get more  information about your condition. How is this treated? Treatment depends on the type of dyslipidemia that you have and your other risk factors for heart disease and stroke. Your health care provider will have a target range for your lipid levels based on this information. For many people, treatment starts with lifestyle changes, such as diet and exercise. Your health care provider may recommend that you:  Get regular exercise.  Make changes to your diet.  Quit smoking if you smoke.  If diet changes and exercise do not help you reach your goals, your health care provider may also prescribe medicine to lower lipids. The most commonly prescribed type of medicine lowers your LDL cholesterol (statin drug). If you have a high triglyceride level, your provider may prescribe another type of drug (fibrate) or an omega-3 fish oil supplement, or both. Follow these instructions at home:  Take over-the-counter and prescription medicines only as told by your health care provider. This includes supplements.  Get regular exercise. Start an aerobic exercise and strength training program as told by your health care provider. Ask your health care provider what activities are safe for you. Your health care provider may recommend: ? 30 minutes of aerobic activity 4-6 days a week. Brisk walking is an example of aerobic activity. ? Strength training 2 days a week.  Eat a healthy diet as told by your health care provider. This can help you reach and maintain a healthy weight, lower your LDL cholesterol, and raise your HDL cholesterol. It may help to work with a diet and nutrition specialist (dietitian) to make a plan that is right for you. Your dietitian or health care provider may recommend: ? Limiting your calories, if you are overweight. ? Eating more fruits, vegetables, whole grains, fish, and lean meats. ? Limiting saturated fat, trans fat, and cholesterol.  Follow instructions from your health care provider  or dietitian about eating or drinking restrictions.  Limit alcohol intake to no more than one drink per day for nonpregnant women and two drinks per day for men. One drink equals 12 oz of beer, 5 oz of wine, or 1 oz of hard liquor.  Do not use any products that contain nicotine or tobacco, such as cigarettes and e-cigarettes. If you need help quitting, ask your health care provider.  Keep all follow-up visits as told by your health care provider. This is important. Contact a health care provider if:  You are having trouble sticking to your exercise or diet plan.  You are struggling to quit smoking or control your use of alcohol. Summary  Dyslipidemia is an imbalance of waxy, fat-like substances (lipids) in the blood. The body needs lipids in small amounts. Dyslipidemia often involves a high level of cholesterol or triglycerides, which are types of lipids.  Treatment depends on the type of dyslipidemia that you have and your other risk factors for heart disease and stroke.  For many people, treatment starts with lifestyle changes, such as diet and exercise. Your health care provider may also prescribe medicine to lower lipids. This information is not intended to replace advice given to you by your health care provider. Make sure you discuss any questions you have with your health care provider. Document Released: 01/07/2013 Document Revised: 08/30/2015 Document Reviewed: 08/30/2015 Elsevier Interactive Patient Education  2018 [employer].  Pruritus Pruritus is an itching  feeling. There are many different conditions and factors that can make your skin itchy. Dry skin is one of the most common causes of itching. Most cases of itching do not require medical attention. Itchy skin can turn into a rash. Follow these instructions at home: Watch your pruritus for any changes. Take these steps to help with your condition: Skin Care  Moisturize your skin as needed. A moisturizer that contains  petroleum jelly is best for keeping moisture in your skin.  Take or apply medicines only as directed by your health care provider. This may include: ? Corticosteroid cream. ? Anti-itch lotions. ? Oral anti-histamines.  Apply cool compresses to the affected areas.  Try taking a bath with: ? Epsom salts. Follow the instructions on the packaging. You can get these at your local pharmacy or grocery store. ? Baking soda. Pour a small amount into the bath as directed by your health care provider. ? Colloidal oatmeal. Follow the instructions on the packaging. You can get this at your local pharmacy or grocery store.  Try applying baking soda paste to your skin. Stir water into baking soda until it reaches a paste-like consistency.  Do not scratch your skin.  Avoid hot showers or baths, which can make itching worse. A cold shower may help with itching as long as you use a moisturizer after.  Avoid scented soaps, detergents, and perfumes. Use gentle soaps, detergents, perfumes, and other cosmetic products. General instructions  Avoid wearing tight clothes.  Keep a journal to help track what causes your itch. Write down: ? What you eat. ? What cosmetic products you use. ? What you drink. ? What you wear. This includes jewelry.  Use a humidifier. This keeps the air moist, which helps to prevent dry skin. Contact a health care provider if:  The itching does not go away after several days.  You sweat at night.  You have weight loss.  You are unusually thirsty.  You urinate more than normal.  You are more tired than normal.  You have abdominal pain.  Your skin tingles.  You feel weak.  Your skin or the whites of your eyes look yellow (jaundice).  Your skin feels numb. This information is not intended to replace advice given to you by your health care provider. Make sure you discuss any questions you have with your health care provider. Document Released: 09/14/2010 Document  Revised: 06/10/2015 Document Reviewed: 12/29/2013 Elsevier Interactive Patient Education  2018 [place] all medications as directed, with the two changes: 1) Stop OTC Zyrtec and start OTC Claritin each morning. 2) Please take Hydroxyzine 25mg  at bedtime- do no drive or drink alcohol when using this medications. Follow above care instructions for pruritis and referral placed to Allergist. We will call you when lab results are available. Follow-up 6 months for complete physical with fasting labs. NICE TO SEE YOU!

## 2017-05-29 NOTE — Assessment & Plan Note (Signed)
Continue all medications as directed, with the two changes: 1) Stop OTC Zyrtec and start OTC Claritin each morning. 2) Please take Hydroxyzine 25mg  at bedtime- do no drive or drink alcohol when using this medications. Follow above care instructions for pruritis and referral placed to Allergist. We will call you when lab results are available. Follow-up 6 months for complete physical with fasting labs.

## 2017-05-29 NOTE — Assessment & Plan Note (Signed)
She has been on Lovaza 1g QD for years She was started on Atorvastatin 10mg  QD Nov 18 LFTs checked 01/18/17- WNL,24 Lipids re-checked today.

## 2017-05-29 NOTE — Assessment & Plan Note (Signed)
1) Stop OTC Zyrtec and start OTC Claritin each morning. 2) Please take Hydroxyzine 25mg  at bedtime- do no drive or drink alcohol when using this medications. Follow above care instructions for pruritis and referral placed to Allergist.

## 2017-05-29 NOTE — Progress Notes (Signed)
Subjective:    Patient ID: Veronica Leonard, female    DOB: [birth year], 62 y.o.   MRN: [id number]  HPI :  Veronica Leonard presents for f/u: HLD She has been following a diet low in CHO/saturated fat and "working in the yard as much as a I can". She have been increasing plain water intake and continues to abstain from tobacco. She continues to exp sig, diffuse pruritis- began during a visit to [place] over Easter Weekend. She was seen 05/14/17- treated with Prednisone taper and she reports sx's on;y reduced when she was on the higher dose of Prednisone. She continues to exp pruritis of bil hands, arms, anterior chest, neck, and face. She reports abdominal itching has resolved. She denies CP/dyspnea/wheezing/difficultly swallowing/N/V/D/fever/malaise/poor appetite. She has been using OTC Zyrtec, OTC Benadryl, OTC Hydrocortisone cream, and "Baking Powder" baths with only minimal sx relief. She reports using Zyrtec on/off for the last 3 years.  Patient Care Team    Relationship Specialty Notifications Start End  [name] D, NP PCP - General Family Medicine  08/29/16   Huel Cote, NP Nurse Practitioner Obstetrics and Gynecology  08/29/16     Patient Active Problem List   Diagnosis Date Noted  . Pruritus of skin 05/14/2017  . Contact dermatitis 05/14/2017  . Excessive cerumen in both ear canals 03/20/2017  . Acute recurrent maxillary sinusitis 03/20/2017  . Elevated LDL cholesterol level 11/29/2016  . Screening for colon cancer 11/29/2016  . Breast nodule 11/29/2016  . Health care maintenance 08/29/2016  . Hyperlipidemia 08/29/2016  . Seasonal allergies   . Anxiety   . Benign colonic polyp      Past Medical History:  Diagnosis Date  . Anxiety    SITUATIONAL  . Hyperlipidemia   . Seasonal allergies      History reviewed. No pertinent surgical history.   Family History  Problem Relation Age of Onset  . Diabetes Father   . Hypertension Sister   . Heart disease Sister    . Heart murmur Sister   . Diabetes Brother      Social History   Substance and Sexual Activity  Drug Use No     Social History   Substance and Sexual Activity  Alcohol Use Yes   Comment: social     Social History   Tobacco Use  Smoking Status Never Smoker  Smokeless Tobacco Never Used     Outpatient Encounter Medications as of 05/29/2017  Medication Sig  . atorvastatin (LIPITOR) 10 MG tablet Take 1 tablet (10 mg total) daily by mouth.  . Calcium Carbonate Antacid (TUMS PO) Take by mouth as needed.  . cetirizine (ZYRTEC) 10 MG tablet 1/2 tab prn  . Cholecalciferol (VITAMIN D3) 2000 units TABS Take 2,000 Units daily by mouth.  . Ibuprofen (MIDOL PO) Take by mouth as needed.  . magnesium 30 MG tablet Take 30 mg daily by mouth.  . omega-3 acid ethyl esters (LOVAZA) 1 g capsule Take 1 g daily by mouth.  . predniSONE (STERAPRED UNI-PAK 21 TAB) 10 MG (21) TBPK tablet Take per pak instructions. Dispense 1 box  . pseudoephedrine (SUDAFED) 30 MG tablet Take 30 mg every 4 (four) hours as needed by mouth for congestion.  . valACYclovir (VALTREX) 500 MG tablet Take ONE TAB twice daily for 3-5 days as needed  . hydrOXYzine (ATARAX/VISTARIL) 25 MG tablet Take 1 tablet (25 mg total) by mouth at bedtime.   No facility-administered encounter medications on file as of 05/29/2017.  Allergies: Patient has no known allergies.  Body mass index is 36.02 kg/m.  Blood pressure 132/79, pulse 81, height 5' 2.25" (1.581 m), weight 198 lb 8 oz (90 kg), last menstrual period 09/14/2012, SpO2 99 %.  Review of Systems  Constitutional: Positive for fatigue. Negative for activity change, appetite change, chills, diaphoresis, fever and unexpected weight change.  HENT: Negative for congestion.   Eyes: Negative for visual disturbance.  Respiratory: Negative for cough, chest tightness, shortness of breath and wheezing.   Cardiovascular: Negative for chest pain, palpitations and leg swelling.    Gastrointestinal: Negative for abdominal distention, abdominal pain, blood in stool, constipation, diarrhea, nausea and vomiting.  Endocrine: Negative for cold intolerance, heat intolerance, polydipsia, polyphagia and polyuria.  Genitourinary: Negative for difficulty urinating, flank pain and hematuria.  Skin: Positive for color change and rash. Negative for pallor and wound.  Neurological: Negative for dizziness and headaches.  Hematological: Does not bruise/bleed easily.  Psychiatric/Behavioral: Positive for sleep disturbance. Negative for dysphoric mood, hallucinations, self-injury and suicidal ideas. The patient is nervous/anxious. The patient is not hyperactive.        Objective:   Physical Exam  Constitutional: She is oriented to person, place, and time. She appears well-developed and well-nourished. No distress.  HENT:  Head: Normocephalic and atraumatic.  Right Ear: External ear normal.  Left Ear: External ear normal.  Eyes: Pupils are equal, round, and reactive to light. Conjunctivae and EOM are normal.  Cardiovascular: Normal rate, regular rhythm, normal heart sounds and intact distal pulses.  No murmur heard. Pulmonary/Chest: Breath sounds normal. No stridor. No respiratory distress. She has no wheezes. She has no rales. She exhibits no tenderness.  Neurological: She is alert and oriented to person, place, and time.  Skin: Skin is warm and dry. Capillary refill takes less than 2 seconds. Rash noted. She is not diaphoretic. There is erythema. No pallor.     Slight edema and erythema noted on bil FAs, bil ACs, anterior chest, anterior neck and cheeks. No open tissue/drainage/streaking/excessive warmth noted.   Psychiatric: She has a normal mood and affect. Her behavior is normal. Judgment and thought content normal.      Assessment & Plan:   1. Hyperlipidemia, unspecified hyperlipidemia type   2. Pruritus of skin   3. Health care maintenance     Hyperlipidemia She has  been on Lovaza 1g QD for years She was started on Atorvastatin 10mg  QD Nov 18 LFTs checked 01/18/17- WNL,24 Lipids re-checked today.  Health care maintenance Continue all medications as directed, with the two changes: 1) Stop OTC Zyrtec and start OTC Claritin each morning. 2) Please take Hydroxyzine 25mg  at bedtime- do no drive or drink alcohol when using this medications. Follow above care instructions for pruritis and referral placed to Allergist. We will call you when lab results are available. Follow-up 6 months for complete physical with fasting labs.  Pruritus of skin 1) Stop OTC Zyrtec and start OTC Claritin each morning. 2) Please take Hydroxyzine 25mg  at bedtime- do no drive or drink alcohol when using this medications. Follow above care instructions for pruritis and referral placed to Allergist.    FOLLOW-UP:  Return in about 6 months (around 11/29/2017) for CPE, Fasting Labs.

## 2017-05-30 LAB — LIPID PANEL
CHOLESTEROL TOTAL: 230 mg/dL — AB (ref 100–199)
Chol/HDL Ratio: 4.6 ratio — ABNORMAL HIGH (ref 0.0–4.4)
HDL: 50 mg/dL (ref >39)
LDL Calculated: 144 mg/dL — ABNORMAL HIGH (ref 0–99)
Triglycerides: 181 mg/dL — ABNORMAL HIGH (ref 0–149)
VLDL CHOLESTEROL CAL: 36 mg/dL (ref 5–40)

## 2017-06-12 ENCOUNTER — Encounter: Payer: Self-pay | Admitting: Allergy and Immunology

## 2017-06-13 NOTE — Progress Notes (Signed)
Received Epic notification that pt has not read MyChart message regarding results.    This message is to inform you that the patient has not yet read the following message. (Notification date: Jun 13, 2017)  Recent lab results   From Fonnie Mu, CMA To Veronica Leonard "Ronald Pippins" Sent 05/30/2017 3:50 PM  Dear Ms. Lamount Cohen asked that I inform you that your 10-year ASCVD ( acute sudden cardiovascular event such as a stroke or heart attack) risk score Mikey Bussing DC Brooke Bonito., et al., 2013) is: 3.8%  Values used to calculate the score:   Age: 62 years   Sex: Female   Is Non-Hispanic African American: No   Diabetic: No   Tobacco smoker: No   Systolic Blood Pressure: 165 mmHg   Is BP treated: No   HDL Cholesterol: 50 mg/dL   Total Cholesterol: 230 mg/dL  LDL (bad cholesterol) 144  Things look stable, continue current therapy.   Wishing you well,  Kenney Houseman, CMA for  [name], NP   [address]   MyChart User Last Read On  Veronica Leonard "Ronald Pippins" Not Read      Letter mailed to patient.  Charyl Bigger, CMA

## 2017-06-20 ENCOUNTER — Ambulatory Visit: Payer: BLUE CROSS/BLUE SHIELD | Admitting: Women's Health

## 2017-06-20 ENCOUNTER — Encounter: Payer: Self-pay | Admitting: Women's Health

## 2017-06-20 VITALS — BP 122/80 | Ht 62.0 in | Wt 194.0 lb

## 2017-06-20 DIAGNOSIS — B009 Herpesviral infection, unspecified: Secondary | ICD-10-CM

## 2017-06-20 DIAGNOSIS — Z01419 Encounter for gynecological examination (general) (routine) without abnormal findings: Secondary | ICD-10-CM

## 2017-06-20 DIAGNOSIS — Z13820 Encounter for screening for osteoporosis: Secondary | ICD-10-CM

## 2017-06-20 MED ORDER — VALACYCLOVIR HCL 500 MG PO TABS: ORAL_TABLET | ORAL | 12 refills | Status: DC

## 2017-06-20 NOTE — Progress Notes (Signed)
Veronica Leonard 10-04-1955 [id number]    History:    Presents for annual exam.  Postmenopausal on no HRT with no bleeding.  Rare intercourse husband history of prostate cancer.  Normal Pap and mammogram history.  2009- colonoscopy.  Has not had a screening bone density.  Primary care manages labs and meds for hypercholesteremia and anxiety.  HSV rare outbreaks.  Past medical history, past surgical history, family history and social history were all reviewed and documented in the EPIC chart.  Works in a daycare and loves it.  Husband is mostly living with his mother to help care for her.  Has 3 children all doing well.  ROS:  A ROS was performed and pertinent positives and negatives are included.  Exam:  Vitals:   06/20/17 1015  BP: 122/80  Weight: 194 lb (88 kg)  Height: 5\' 2"  (1.575 m)   Body mass index is 35.48 kg/m.   General appearance:  Normal Thyroid:  Symmetrical, normal in size, without palpable masses or nodularity. Respiratory  Auscultation:  Clear without wheezing or rhonchi Cardiovascular  Auscultation:  Regular rate, without rubs, murmurs or gallops  Edema/varicosities:  Not grossly evident Abdominal  Soft,nontender, without masses, guarding or rebound.  Liver/spleen:  No organomegaly noted  Hernia:  None appreciated  Skin  Inspection:  Grossly normal   Breasts: Examined lying and sitting.     Right: Fibroglandular lower aqspect, Without masses, retractions, discharge or axillary adenopathy.     Left: Without masses, retractions, discharge or axillary adenopathy. Gentitourinary   Inguinal/mons:  Normal without inguinal adenopathy  External genitalia:  Normal  BUS/Urethra/Skene's glands:  Normal  Vagina:  Normal  Cervix:  Normal  Uterus:   normal in size, shape and contour.  Midline and mobile  Adnexa/parametria:     Rt: Without masses or tenderness.   Lt: Without masses or tenderness.  Anus and perineum: Normal  Digital rectal exam: Normal sphincter  tone without palpated masses or tenderness  Assessment/Plan:  62 y.o. MWF G3P3 for annual exam with no complaints.  Postmenopausal/no HRT/no bleeding Hypercholesteremia-primary care manages labs and meds Obesity HSV-rare outbreaks  Plan: Valtrex 500 twice daily for 3 to 5 days as needed prescription, proper use given and reviewed.  SBE's, continue annual screening mammograms, calcium rich foods, vitamin D 2000 daily encouraged.  Reviewed importance of home safety, fall prevention and weightbearing exercise.  Encouraged to decrease calorie/carbs and increase exercise for weight loss.  Schedule bone density.  Pap normal 2016, Pap with HR HPV typing, new screening guidelines reviewed.  Shingrex encouraged, will get at primary care.  Schedule repeat screening colonoscopy.    Huel Cote [facility], 10:55 AM 06/20/2017

## 2017-06-20 NOTE — Addendum Note (Signed)
Addended by: Lorine Bears on: 06/20/2017 11:06 AM   Modules accepted: Orders

## 2017-06-20 NOTE — Patient Instructions (Signed)
Health Maintenance for Postmenopausal Women Menopause is a normal process in which your reproductive ability comes to an end. This process happens gradually over a span of months to years, usually between the ages of 22 and 9. Menopause is complete when you have missed 12 consecutive menstrual periods. It is important to talk with your health care provider about some of the most common conditions that affect postmenopausal women, such as heart disease, cancer, and bone loss (osteoporosis). Adopting a healthy lifestyle and getting preventive care can help to promote your health and wellness. Those actions can also lower your chances of developing some of these common conditions. What should I know about menopause? During menopause, you may experience a number of symptoms, such as:  Moderate-to-severe hot flashes.  Night sweats.  Decrease in sex drive.  Mood swings.  Headaches.  Tiredness.  Irritability.  Memory problems.  Insomnia.  Choosing to treat or not to treat menopausal changes is an individual decision that you make with your health care provider. What should I know about hormone replacement therapy and supplements? Hormone therapy products are effective for treating symptoms that are associated with menopause, such as hot flashes and night sweats. Hormone replacement carries certain risks, especially as you become older. If you are thinking about using estrogen or estrogen with progestin treatments, discuss the benefits and risks with your health care provider. What should I know about heart disease and stroke? Heart disease, heart attack, and stroke become more likely as you age. This may be due, in part, to the hormonal changes that your body experiences during menopause. These can affect how your body processes dietary fats, triglycerides, and cholesterol. Heart attack and stroke are both medical emergencies. There are many things that you can do to help prevent heart disease  and stroke:  Have your blood pressure checked at least every 1-2 years. High blood pressure causes heart disease and increases the risk of stroke.  If you are 53-22 years old, ask your health care provider if you should take aspirin to prevent a heart attack or a stroke.  Do not use any tobacco products, including cigarettes, chewing tobacco, or electronic cigarettes. If you need help quitting, ask your health care provider.  It is important to eat a healthy diet and maintain a healthy weight. ? Be sure to include plenty of vegetables, fruits, low-fat dairy products, and lean protein. ? Avoid eating foods that are high in solid fats, added sugars, or salt (sodium).  Get regular exercise. This is one of the most important things that you can do for your health. ? Try to exercise for at least 150 minutes each week. The type of exercise that you do should increase your heart rate and make you sweat. This is known as moderate-intensity exercise. ? Try to do strengthening exercises at least twice each week. Do these in addition to the moderate-intensity exercise.  Know your numbers.Ask your health care provider to check your cholesterol and your blood glucose. Continue to have your blood tested as directed by your health care provider.  What should I know about cancer screening? There are several types of cancer. Take the following steps to reduce your risk and to catch any cancer development as early as possible. Breast Cancer  Practice breast self-awareness. ? This means understanding how your breasts normally appear and feel. ? It also means doing regular breast self-exams. Let your health care provider know about any changes, no matter how small.  If you are 40  or older, have a clinician do a breast exam (clinical breast exam or CBE) every year. Depending on your age, family history, and medical history, it may be recommended that you also have a yearly breast X-ray (mammogram).  If you  have a family history of breast cancer, talk with your health care provider about genetic screening.  If you are at high risk for breast cancer, talk with your health care provider about having an MRI and a mammogram every year.  Breast cancer (BRCA) gene test is recommended for women who have family members with BRCA-related cancers. Results of the assessment will determine the need for genetic counseling and BRCA1 and for BRCA2 testing. BRCA-related cancers include these types: ? Breast. This occurs in males or females. ? Ovarian. ? Tubal. This may also be called fallopian tube cancer. ? Cancer of the abdominal or pelvic lining (peritoneal cancer). ? Prostate. ? Pancreatic.  Cervical, Uterine, and Ovarian Cancer Your health care provider may recommend that you be screened regularly for cancer of the pelvic organs. These include your ovaries, uterus, and vagina. This screening involves a pelvic exam, which includes checking for microscopic changes to the surface of your cervix (Pap test).  For women ages 21-65, health care providers may recommend a pelvic exam and a Pap test every three years. For women ages 79-65, they may recommend the Pap test and pelvic exam, combined with testing for human papilloma virus (HPV), every five years. Some types of HPV increase your risk of cervical cancer. Testing for HPV may also be done on women of any age who have unclear Pap test results.  Other health care providers may not recommend any screening for nonpregnant women who are considered low risk for pelvic cancer and have no symptoms. Ask your health care provider if a screening pelvic exam is right for you.  If you have had past treatment for cervical cancer or a condition that could lead to cancer, you need Pap tests and screening for cancer for at least 20 years after your treatment. If Pap tests have been discontinued for you, your risk factors (such as having a new sexual partner) need to be  reassessed to determine if you should start having screenings again. Some women have medical problems that increase the chance of getting cervical cancer. In these cases, your health care provider may recommend that you have screening and Pap tests more often.  If you have a family history of uterine cancer or ovarian cancer, talk with your health care provider about genetic screening.  If you have vaginal bleeding after reaching menopause, tell your health care provider.  There are currently no reliable tests available to screen for ovarian cancer.  Lung Cancer Lung cancer screening is recommended for adults 69-62 years old who are at high risk for lung cancer because of a history of smoking. A yearly low-dose CT scan of the lungs is recommended if you:  Currently smoke.  Have a history of at least 30 pack-years of smoking and you currently smoke or have quit within the past 15 years. A pack-year is smoking an average of one pack of cigarettes per day for one year.  Yearly screening should:  Continue until it has been 15 years since you quit.  Stop if you develop a health problem that would prevent you from having lung cancer treatment.  Colorectal Cancer  This type of cancer can be detected and can often be prevented.  Routine colorectal cancer screening usually begins at  age 42 and continues through age 45.  If you have risk factors for colon cancer, your health care provider may recommend that you be screened at an earlier age.  If you have a family history of colorectal cancer, talk with your health care provider about genetic screening.  Your health care provider may also recommend using home test kits to check for hidden blood in your stool.  A small camera at the end of a tube can be used to examine your colon directly (sigmoidoscopy or colonoscopy). This is done to check for the earliest forms of colorectal cancer.  Direct examination of the colon should be repeated every  5-10 years until age 71. However, if early forms of precancerous polyps or small growths are found or if you have a family history or genetic risk for colorectal cancer, you may need to be screened more often.  Skin Cancer  Check your skin from head to toe regularly.  Monitor any moles. Be sure to tell your health care provider: ? About any new moles or changes in moles, especially if there is a change in a mole's shape or color. ? If you have a mole that is larger than the size of a pencil eraser.  If any of your family members has a history of skin cancer, especially at a Jaya Lapka age, talk with your health care provider about genetic screening.  Always use sunscreen. Apply sunscreen liberally and repeatedly throughout the day.  Whenever you are outside, protect yourself by wearing long sleeves, pants, a wide-brimmed hat, and sunglasses.  What should I know about osteoporosis? Osteoporosis is a condition in which bone destruction happens more quickly than new bone creation. After menopause, you may be at an increased risk for osteoporosis. To help prevent osteoporosis or the bone fractures that can happen because of osteoporosis, the following is recommended:  If you are 46-71 years old, get at least 1,000 mg of calcium and at least 600 mg of vitamin D per day.  If you are older than age 55 but younger than age 65, get at least 1,200 mg of calcium and at least 600 mg of vitamin D per day.  If you are older than age 54, get at least 1,200 mg of calcium and at least 800 mg of vitamin D per day.  Smoking and excessive alcohol intake increase the risk of osteoporosis. Eat foods that are rich in calcium and vitamin D, and do weight-bearing exercises several times each week as directed by your health care provider. What should I know about how menopause affects my mental health? Depression may occur at any age, but it is more common as you become older. Common symptoms of depression  include:  Low or sad mood.  Changes in sleep patterns.  Changes in appetite or eating patterns.  Feeling an overall lack of motivation or enjoyment of activities that you previously enjoyed.  Frequent crying spells.  Talk with your health care provider if you think that you are experiencing depression. What should I know about immunizations? It is important that you get and maintain your immunizations. These include:  Tetanus, diphtheria, and pertussis (Tdap) booster vaccine.  Influenza every year before the flu season begins.  Pneumonia vaccine.  Shingles vaccine.  Your health care provider may also recommend other immunizations. This information is not intended to replace advice given to you by your health care provider. Make sure you discuss any questions you have with your health care provider. Document Released: 02/24/2005  Document Revised: 07/23/2015 Document Reviewed: 10/06/2014 Elsevier Interactive Patient Education  2018 [employer]  

## 2017-06-21 LAB — PAP, TP IMAGING W/ HPV RNA, RFLX HPV TYPE 16,18/45: HPV DNA High Risk: NOT DETECTED

## 2017-09-24 NOTE — Progress Notes (Signed)
Subjective:    Patient ID: Veronica Leonard, female    DOB: January 31, 1955, 62 y.o.   MRN: [id number]  HPI:  Ms. Haidar presents with R otalgia  (3/10, constant dull ache) and decreased hearing that has been present >1 week. She also reports clear ear discharge with foul order that occurred 6 days ago. She has recurrent ear cerumen, post nasal gtt, and nose bleeds (last bleed was 5 days ago, she was able to stop with pressure). She has ENT, last OV April 2019.  ENT recommended she be seen by Allergist to address chronic allergies and sinusitis. She has been taking OTC Aleve and Neomycin Polymyxin ear gtt's (she received from ENT). She denies HA/dizziness/HA/CP/dyspnea/palpitations. She has been trying to increase water intake.  Patient Care Team    Relationship Specialty Notifications Start End  [name] D, NP PCP - General Family Medicine  08/29/16   Huel Cote, NP Nurse Practitioner Obstetrics and Gynecology  08/29/16     Patient Active Problem List   Diagnosis Date Noted  . Pruritus of skin 05/14/2017  . Contact dermatitis 05/14/2017  . Excessive cerumen in both ear canals 03/20/2017  . Acute recurrent maxillary sinusitis 03/20/2017  . Elevated LDL cholesterol level 11/29/2016  . Screening for colon cancer 11/29/2016  . Breast nodule 11/29/2016  . Health care maintenance 08/29/2016  . Hyperlipidemia 08/29/2016  . Seasonal allergies   . Anxiety   . Benign colonic polyp      Past Medical History:  Diagnosis Date  . Anxiety    SITUATIONAL  . Hyperlipidemia   . Seasonal allergies      History reviewed. No pertinent surgical history.   Family History  Problem Relation Age of Onset  . Diabetes Father   . Hypertension Sister   . Heart disease Sister   . Heart murmur Sister   . Diabetes Brother      Social History   Substance and Sexual Activity  Drug Use No     Social History   Substance and Sexual Activity  Alcohol Use Not Currently     Social  History   Tobacco Use  Smoking Status Never Smoker  Smokeless Tobacco Never Used     Outpatient Encounter Medications as of 09/25/2017  Medication Sig  . atorvastatin (LIPITOR) 10 MG tablet Take 1 tablet (10 mg total) daily by mouth.  . Calcium Carbonate Antacid (TUMS PO) Take by mouth as needed.  . Cholecalciferol (VITAMIN D3) 2000 units TABS Take 2,000 Units daily by mouth.  . Ibuprofen (MIDOL PO) Take by mouth as needed.  . magnesium 30 MG tablet Take 30 mg daily by mouth.  . omega-3 acid ethyl esters (LOVAZA) 1 g capsule Take 1 g daily by mouth.  . pseudoephedrine (SUDAFED) 30 MG tablet Take 30 mg every 4 (four) hours as needed by mouth for congestion.  . [DISCONTINUED] hydrOXYzine (ATARAX/VISTARIL) 25 MG tablet Take 1 tablet (25 mg total) by mouth at bedtime.  . [DISCONTINUED] Loratadine (CLARITIN PO) Take 1 tablet by mouth daily.  . [DISCONTINUED] valACYclovir (VALTREX) 500 MG tablet Take ONE TAB twice daily for 3-5 days as needed   No facility-administered encounter medications on file as of 09/25/2017.     Allergies: Patient has no known allergies.  Body mass index is 35.94 kg/m.  Blood pressure 122/69, pulse 65, temperature 97.8 F (36.6 C), temperature source Oral, height 5\' 2"  (1.575 m), weight 196 lb 8 oz (89.1 kg), last menstrual period 09/14/2012, SpO2 97 %.  Review of Systems  Constitutional: Positive for fatigue. Negative for activity change, appetite change, chills, diaphoresis, fever and unexpected weight change.  HENT: Positive for congestion, ear discharge, ear pain, hearing loss and postnasal drip. Negative for facial swelling, sore throat, trouble swallowing and voice change.   Eyes: Negative for visual disturbance.  Respiratory: Negative for cough, chest tightness, shortness of breath, wheezing and stridor.   Cardiovascular: Negative for chest pain, palpitations and leg swelling.  Skin: Negative for color change, pallor, rash and wound.  Neurological:  Negative for dizziness and headaches.  Hematological: Does not bruise/bleed easily.       Objective:   Physical Exam  Constitutional: She is oriented to person, place, and time. She appears well-developed and well-nourished. No distress.  HENT:  Head: Normocephalic and atraumatic.  Right Ear: External ear normal. Tympanic membrane is not erythematous and not bulging. No decreased hearing is noted.  Left Ear: External ear normal. Tympanic membrane is not erythematous and not bulging. No decreased hearing is noted.  Nose: Mucosal edema and rhinorrhea present. Right sinus exhibits no maxillary sinus tenderness and no frontal sinus tenderness. Left sinus exhibits no maxillary sinus tenderness and no frontal sinus tenderness.  Mouth/Throat: Uvula is midline, oropharynx is clear and moist and mucous membranes are normal. No oropharyngeal exudate. Tonsils are 0 on the right. Tonsils are 0 on the left.  Large amounts of cerumen flushed from both ears. Hearing returned to normal. Pt tolerated procedure well   Eyes: Pupils are equal, round, and reactive to light. Conjunctivae and EOM are normal.  Neck: Normal range of motion. Neck supple.  Cardiovascular: Normal rate, regular rhythm, normal heart sounds and intact distal pulses.  No murmur heard. Pulmonary/Chest: Effort normal and breath sounds normal. No stridor. No respiratory distress. She has no wheezes. She has no rales. She exhibits no tenderness.  Lymphadenopathy:    She has no cervical adenopathy.  Neurological: She is alert and oriented to person, place, and time.  Skin: Skin is warm and dry. Capillary refill takes less than 2 seconds. No rash noted. She is not diaphoretic. No erythema. No pallor.  Psychiatric: She has a normal mood and affect. Her behavior is normal. Judgment and thought content normal.  Nursing note and vitals reviewed.     Assessment & Plan:   1. Excessive cerumen in both ear canals     Excessive cerumen in both  ear canals Ear flushed with Rhino system  Complete Neomycin-Polymyxin drops. Increase water intake. Follow-up with ENT as needed. Would recommend seeing Allergist to address chronic allergies. Please call clinic with questions/concerns.    FOLLOW-UP:  Return if symptoms worsen or fail to improve.

## 2017-09-25 ENCOUNTER — Encounter: Payer: Self-pay | Admitting: Adult Health

## 2017-09-25 ENCOUNTER — Ambulatory Visit: Payer: BLUE CROSS/BLUE SHIELD | Admitting: Adult Health

## 2017-09-25 VITALS — BP 122/69 | HR 65 | Temp 97.8°F | Ht 62.0 in | Wt 196.5 lb

## 2017-09-25 DIAGNOSIS — H6123 Impacted cerumen, bilateral: Secondary | ICD-10-CM | POA: Diagnosis not present

## 2017-09-25 NOTE — Patient Instructions (Addendum)
Earwax Buildup, Adult The ears produce a substance called earwax that helps keep bacteria out of the ear and protects the skin in the ear canal. Occasionally, earwax can build up in the ear and cause discomfort or hearing loss. What increases the risk? This condition is more likely to develop in people who:  Are female.  Are elderly.  Naturally produce more earwax.  Clean their ears often with cotton swabs.  Use earplugs often.  Use in-ear headphones often.  Wear hearing aids.  Have narrow ear canals.  Have earwax that is overly thick or sticky.  Have eczema.  Are dehydrated.  Have excess hair in the ear canal.  What are the signs or symptoms? Symptoms of this condition include:  Reduced or muffled hearing.  A feeling of fullness in the ear or feeling that the ear is plugged.  Fluid coming from the ear.  Ear pain.  Ear itch.  Ringing in the ear.  Coughing.  An obvious piece of earwax that can be seen inside the ear canal.  How is this diagnosed? This condition may be diagnosed based on:  Your symptoms.  Your medical history.  An ear exam. During the exam, your health care provider will look into your ear with an instrument called an otoscope.  You may have tests, including a hearing test. How is this treated? This condition may be treated by:  Using ear drops to soften the earwax.  Having the earwax removed by a health care provider. The health care provider may: ? Flush the ear with water. ? Use an instrument that has a loop on the end (curette). ? Use a suction device.  Surgery to remove the wax buildup. This may be done in severe cases.  Follow these instructions at home:  Take over-the-counter and prescription medicines only as told by your health care provider.  Do not put any objects, including cotton swabs, into your ear. You can clean the opening of your ear canal with a washcloth or facial tissue.  Follow instructions from your health  care provider about cleaning your ears. Do not over-clean your ears.  Drink enough fluid to keep your urine clear or pale yellow. This will help to thin the earwax.  Keep all follow-up visits as told by your health care provider. If earwax builds up in your ears often or if you use hearing aids, consider seeing your health care provider for routine, preventive ear cleanings. Ask your health care provider how often you should schedule your cleanings.  If you have hearing aids, clean them according to instructions from the manufacturer and your health care provider. Contact a health care provider if:  You have ear pain.  You develop a fever.  You have blood, pus, or other fluid coming from your ear.  You have hearing loss.  You have ringing in your ears that does not go away.  Your symptoms do not improve with treatment.  You feel like the room is spinning (vertigo). Summary  Earwax can build up in the ear and cause discomfort or hearing loss.  The most common symptoms of this condition include reduced or muffled hearing and a feeling of fullness in the ear or feeling that the ear is plugged.  This condition may be diagnosed based on your symptoms, your medical history, and an ear exam.  This condition may be treated by using ear drops to soften the earwax or by having the earwax removed by a health care provider.  Do   not put any objects, including cotton swabs, into your ear. You can clean the opening of your ear canal with a washcloth or facial tissue. This information is not intended to replace advice given to you by your health care provider. Make sure you discuss any questions you have with your health care provider. Document Released: 02/10/2004 Document Revised: 03/15/2016 Document Reviewed: 03/15/2016 Elsevier Interactive Patient Education  2018 [employer].  Complete Neomycin-Polymyxin drops. Increase water intake. Follow-up with ENT as needed. Would recommend seeing  Allergist to address chronic allergies. Please call clinic with questions/concerns. Please schedule complete physical with fasting labs in Nov 2019. FEEL BETTER!

## 2017-09-25 NOTE — Assessment & Plan Note (Addendum)
Ear flushed with Rhino system Large amounts of cerumen flushed from both ears. Hearing returned to normal. Pt tolerated procedure well Complete Neomycin-Polymyxin drops. Increase water intake. Follow-up with ENT as needed. Would recommend seeing Allergist to address chronic allergies. Please call clinic with questions/concerns.

## 2017-11-27 NOTE — Progress Notes (Signed)
Subjective:    Patient ID: Veronica Leonard, female    DOB: [birth year], 62 y.o.   MRN: [id number]  HPI:  [name] is here for CPE She reports medication compliance, denies SE She inconsistently has been exercising and has been eating more "sugar and fat than I should the last few months". She estimates to drink >65 oz water/day and continues to abstain from tobacco/vape/ETOH use. 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  Healthcare Maintenance: PAP-UTD Mammogram-UTD Immunizations-UTD Colonoscopy- due, ordered   Patient Care Team    Relationship Specialty Notifications Start End  [name] D, NP PCP - General Family Medicine  08/29/16   Huel Cote, NP Nurse Practitioner Obstetrics and Gynecology  08/29/16     Patient Active Problem List   Diagnosis Date Noted  . Pruritus of skin 05/14/2017  . Contact dermatitis 05/14/2017  . Excessive cerumen in both ear canals 03/20/2017  . Acute recurrent maxillary sinusitis 03/20/2017  . Elevated LDL cholesterol level 11/29/2016  . Screening for colon cancer 11/29/2016  . Breast nodule 11/29/2016  . Health care maintenance 08/29/2016  . Hyperlipidemia 08/29/2016  . Seasonal allergies   . Anxiety   . Benign colonic polyp      Past Medical History:  Diagnosis Date  . Anxiety    SITUATIONAL  . Hyperlipidemia   . Seasonal allergies      History reviewed. No pertinent surgical history.   Family History  Problem Relation Age of Onset  . Diabetes Father   . Hypertension Sister   . Heart disease Sister   . Heart murmur Sister   . Diabetes Brother      Social History   Substance and Sexual Activity  Drug Use No     Social History   Substance and Sexual Activity  Alcohol Use Not Currently     Social History   Tobacco Use  Smoking Status Never Smoker  Smokeless Tobacco Never Used     Outpatient Encounter Medications as of  11/29/2017  Medication Sig  . atorvastatin (LIPITOR) 10 MG tablet Take 1 tablet (10 mg total) daily by mouth.  . Calcium Carbonate Antacid (TUMS PO) Take by mouth as needed.  . Cholecalciferol (VITAMIN D3) 2000 units TABS Take 2,000 Units daily by mouth.  . Ibuprofen (MIDOL PO) Take by mouth as needed.  . magnesium 30 MG tablet Take 30 mg daily by mouth.  . omega-3 acid ethyl esters (LOVAZA) 1 g capsule Take 1 g daily by mouth.  . pseudoephedrine (SUDAFED) 30 MG tablet Take 30 mg every 4 (four) hours as needed by mouth for congestion.   No facility-administered encounter medications on file as of 11/29/2017.     Allergies: Patient has no known allergies.  Body mass index is 36.05 kg/m.  Blood pressure 123/85, pulse 66, temperature 98.6 F (37 C), temperature source Oral, height 5\' 2"  (1.575 m), weight 197 lb 1.6 oz (89.4 kg), last menstrual period 09/14/2012, SpO2 99 %.     Review of Systems  Constitutional: Positive for fatigue. Negative for activity change, appetite change, chills, diaphoresis, fever and unexpected weight change.  HENT: Positive for congestion and postnasal drip. Negative for sinus pressure, sinus pain and voice change.   Respiratory: Positive for cough. Negative for chest tightness, shortness of breath, wheezing and stridor.   Cardiovascular: Negative for chest pain and palpitations.  Gastrointestinal: Negative for abdominal distention,  abdominal pain, blood in stool, constipation, diarrhea, nausea and vomiting.  Endocrine: Negative for cold intolerance, heat intolerance, polydipsia, polyphagia and polyuria.  Genitourinary: Negative for difficulty urinating and flank pain.  Musculoskeletal: Negative for arthralgias, back pain, gait problem, joint swelling, myalgias, neck pain and neck stiffness.  Skin: Negative for color change, pallor, rash and wound.  Neurological: Negative for dizziness and headaches.  Hematological: Does not bruise/bleed easily.   Psychiatric/Behavioral: Negative for agitation, behavioral problems, confusion, decreased concentration, dysphoric mood, hallucinations, self-injury, sleep disturbance and suicidal ideas. The patient is not nervous/anxious and is not hyperactive.        Objective:   Physical Exam  Constitutional: She is oriented to person, place, and time. She appears well-developed and well-nourished. No distress.  HENT:  Head: Normocephalic and atraumatic.  Right Ear: External ear normal. Tympanic membrane is not erythematous and not bulging. No decreased hearing is noted.  Left Ear: External ear normal. Tympanic membrane is not erythematous and not bulging. No decreased hearing is noted.  Nose: Nose normal. No mucosal edema or rhinorrhea. Right sinus exhibits no maxillary sinus tenderness and no frontal sinus tenderness. Left sinus exhibits no maxillary sinus tenderness and no frontal sinus tenderness.  Mouth/Throat: Oropharynx is clear and moist. Mucous membranes are dry. Normal dentition. No posterior oropharyngeal edema or posterior oropharyngeal erythema. Tonsils are 0 on the right. Tonsils are 0 on the left. No tonsillar exudate.  Eyes: Pupils are equal, round, and reactive to light. Conjunctivae and EOM are normal.  Neck: Normal range of motion. Neck supple.  Cardiovascular: Normal rate, regular rhythm, normal heart sounds and intact distal pulses.  No murmur heard. Pulmonary/Chest: Effort normal and breath sounds normal. No stridor. No respiratory distress. She has no wheezes. She has no rales. She exhibits no tenderness.  Abdominal: Soft. Bowel sounds are normal. She exhibits no distension and no mass. There is no tenderness. There is no rebound and no guarding. No hernia.  Lymphadenopathy:    She has no cervical adenopathy.  Neurological: She is alert and oriented to person, place, and time. Coordination normal.  Skin: Skin is warm and dry. Capillary refill takes less than 2 seconds. She is not  diaphoretic.  Psychiatric: She has a normal mood and affect. Her behavior is normal. Judgment and thought content normal.  Nursing note and vitals reviewed.     Assessment & Plan:   1. Health care maintenance   2. Hyperlipidemia, unspecified hyperlipidemia type   3. Encounter for hepatitis C screening test for low risk patient   4. Screening for HIV (human immunodeficiency virus)     Health care maintenance Increase water intake, strive for at least half of your body weight in oz/day. Follow Heart Healthy diet Increase regular exercise.  Recommend at least 30 minutes daily, 5 days per week of walking, jogging, biking, swimming, YouTube/Pinterest workout videos. Continue all medications as directed. We will call you when lab results are available. Please call your insurance company, re: colonoscopy procedure coverage. Follow-up in 6 months.   Hyperlipidemia Lipids drawn today Currently on atorvastatin 10mg  QD, Lovaza 1g- tolerating well Encouraged to increase regular exercise    FOLLOW-UP:  Return in about 6 months (around 05/30/2018) for Regular Follow Up, Hypercholestermia.

## 2017-11-29 ENCOUNTER — Encounter: Payer: Self-pay | Admitting: Adult Health

## 2017-11-29 ENCOUNTER — Telehealth: Payer: Self-pay | Admitting: Adult Health

## 2017-11-29 ENCOUNTER — Ambulatory Visit (INDEPENDENT_AMBULATORY_CARE_PROVIDER_SITE_OTHER): Payer: BLUE CROSS/BLUE SHIELD | Admitting: Adult Health

## 2017-11-29 VITALS — BP 123/85 | HR 66 | Temp 98.6°F | Ht 62.0 in | Wt 197.1 lb

## 2017-11-29 DIAGNOSIS — E785 Hyperlipidemia, unspecified: Secondary | ICD-10-CM | POA: Diagnosis not present

## 2017-11-29 DIAGNOSIS — Z1159 Encounter for screening for other viral diseases: Secondary | ICD-10-CM

## 2017-11-29 DIAGNOSIS — Z0000 Encounter for general adult medical examination without abnormal findings: Secondary | ICD-10-CM | POA: Diagnosis not present

## 2017-11-29 DIAGNOSIS — Z114 Encounter for screening for human immunodeficiency virus [HIV]: Secondary | ICD-10-CM

## 2017-11-29 MED ORDER — ATORVASTATIN CALCIUM 10 MG PO TABS: 10.0000 mg | ORAL_TABLET | Freq: Every day | ORAL | 3 refills | Status: DC

## 2017-11-29 NOTE — Patient Instructions (Addendum)
Preventive Care for Adults, Female  A healthy lifestyle and preventive care can promote health and wellness. Preventive health guidelines for women include the following key practices.   A routine yearly physical is a good way to check with your health care provider about your health and preventive screening. It is a chance to share any concerns and updates on your health and to receive a thorough exam.   Visit your dentist for a routine exam and preventive care every 6 months. Brush your teeth twice a day and floss once a day. Good oral hygiene prevents tooth decay and gum disease.   The frequency of eye exams is based on your age, health, family medical history, use of contact lenses, and other factors. Follow your health care provider's recommendations for frequency of eye exams.   Eat a healthy diet. Foods like vegetables, fruits, whole grains, low-fat dairy products, and lean protein foods contain the nutrients you need without too many calories. Decrease your intake of foods high in solid fats, added sugars, and salt. Eat the right amount of calories for you.Get information about a proper diet from your health care provider, if necessary.   Regular physical exercise is one of the most important things you can do for your health. Most adults should get at least 150 minutes of moderate-intensity exercise (any activity that increases your heart rate and causes you to sweat) each week. In addition, most adults need muscle-strengthening exercises on 2 or more days a week.   Maintain a healthy weight. The body mass index (BMI) is a screening tool to identify possible weight problems. It provides an estimate of body fat based on height and weight. Your health care provider can find your BMI, and can help you achieve or maintain a healthy weight.For adults 20 years and older:   - A BMI below 18.5 is considered underweight.   - A BMI of 18.5 to 24.9 is normal.   - A BMI of 25 to 29.9 is  considered overweight.   - A BMI of 30 and above is considered obese.   Maintain normal blood lipids and cholesterol levels by exercising and minimizing your intake of trans and saturated fats.  Eat a balanced diet with plenty of fruit and vegetables. Blood tests for lipids and cholesterol should begin at age 20 and be repeated every 5 years minimum.  If your lipid or cholesterol levels are high, you are over 40, or you are at high risk for heart disease, you may need your cholesterol levels checked more frequently.Ongoing high lipid and cholesterol levels should be treated with medicines if diet and exercise are not working.   If you smoke, find out from your health care provider how to quit. If you do not use tobacco, do not start.   Lung cancer screening is recommended for adults aged 55-80 years who are at high risk for developing lung cancer because of a history of smoking. A yearly low-dose CT scan of the lungs is recommended for people who have at least a 30-pack-year history of smoking and are a current smoker or have quit within the past 15 years. A pack year of smoking is smoking an average of 1 pack of cigarettes a day for 1 year (for example: 1 pack a day for 30 years or 2 packs a day for 15 years). Yearly screening should continue until the smoker has stopped smoking for at least 15 years. Yearly screening should be stopped for people who develop a   health problem that would prevent them from having lung cancer treatment.   If you are pregnant, do not drink alcohol. If you are breastfeeding, be very cautious about drinking alcohol. If you are not pregnant and choose to drink alcohol, do not have more than 1 drink per day. One drink is considered to be 12 ounces (355 mL) of beer, 5 ounces (148 mL) of wine, or 1.5 ounces (44 mL) of liquor.   Avoid use of street drugs. Do not share needles with anyone. Ask for help if you need support or instructions about stopping the use of  drugs.   High blood pressure causes heart disease and increases the risk of stroke. Your blood pressure should be checked at least yearly.  Ongoing high blood pressure should be treated with medicines if weight loss and exercise do not work.   If you are 69-55 years old, ask your health care provider if you should take aspirin to prevent strokes.   Diabetes screening involves taking a blood sample to check your fasting blood sugar level. This should be done once every 3 years, after age 38, if you are within normal weight and without risk factors for diabetes. Testing should be considered at a younger age or be carried out more frequently if you are overweight and have at least 1 risk factor for diabetes.   Breast cancer screening is essential preventive care for women. You should practice "breast self-awareness."  This means understanding the normal appearance and feel of your breasts and may include breast self-examination.  Any changes detected, no matter how small, should be reported to a health care provider.  Women in their 80s and 30s should have a clinical breast exam (CBE) by a health care provider as part of a regular health exam every 1 to 3 years.  After age 66, women should have a CBE every year.  Starting at age 1, women should consider having a mammogram (breast X-ray test) every year.  Women who have a family history of breast cancer should talk to their health care provider about genetic screening.  Women at a high risk of breast cancer should talk to their health care providers about having an MRI and a mammogram every year.   -Breast cancer gene (BRCA)-related cancer risk assessment is recommended for women who have family members with BRCA-related cancers. BRCA-related cancers include breast, ovarian, tubal, and peritoneal cancers. Having family members with these cancers may be associated with an increased risk for harmful changes (mutations) in the breast cancer genes BRCA1 and  BRCA2. Results of the assessment will determine the need for genetic counseling and BRCA1 and BRCA2 testing.   The Pap test is a screening test for cervical cancer. A Pap test can show cell changes on the cervix that might become cervical cancer if left untreated. A Pap test is a procedure in which cells are obtained and examined from the lower end of the uterus (cervix).   - Women should have a Pap test starting at age 57.   - Between ages 90 and 70, Pap tests should be repeated every 2 years.   - Beginning at age 63, you should have a Pap test every 3 years as long as the past 3 Pap tests have been normal.   - Some women have medical problems that increase the chance of getting cervical cancer. Talk to your health care provider about these problems. It is especially important to talk to your health care provider if a  new problem develops soon after your last Pap test. In these cases, your health care provider may recommend more frequent screening and Pap tests.   - The above recommendations are the same for women who have or have not gotten the vaccine for human papillomavirus (HPV).   - If you had a hysterectomy for a problem that was not cancer or a condition that could lead to cancer, then you no longer need Pap tests. Even if you no longer need a Pap test, a regular exam is a good idea to make sure no other problems are starting.   - If you are between ages 36 and 66 years, and you have had normal Pap tests going back 10 years, you no longer need Pap tests. Even if you no longer need a Pap test, a regular exam is a good idea to make sure no other problems are starting.   - If you have had past treatment for cervical cancer or a condition that could lead to cancer, you need Pap tests and screening for cancer for at least 20 years after your treatment.   - If Pap tests have been discontinued, risk factors (such as a new sexual partner) need to be reassessed to determine if screening should  be resumed.   - The HPV test is an additional test that may be used for cervical cancer screening. The HPV test looks for the virus that can cause the cell changes on the cervix. The cells collected during the Pap test can be tested for HPV. The HPV test could be used to screen women aged 70 years and older, and should be used in women of any age who have unclear Pap test results. After the age of 67, women should have HPV testing at the same frequency as a Pap test.   Colorectal cancer can be detected and often prevented. Most routine colorectal cancer screening begins at the age of 57 years and continues through age 26 years. However, your health care provider may recommend screening at an earlier age if you have risk factors for colon cancer. On a yearly basis, your health care provider may provide home test kits to check for hidden blood in the stool.  Use of a small camera at the end of a tube, to directly examine the colon (sigmoidoscopy or colonoscopy), can detect the earliest forms of colorectal cancer. Talk to your health care provider about this at age 23, when routine screening begins. Direct exam of the colon should be repeated every 5 -10 years through age 49 years, unless early forms of pre-cancerous polyps or small growths are found.   People who are at an increased risk for hepatitis B should be screened for this virus. You are considered at high risk for hepatitis B if:  -You were born in a country where hepatitis B occurs often. Talk with your health care provider about which countries are considered high risk.  - Your parents were born in a high-risk country and you have not received a shot to protect against hepatitis B (hepatitis B vaccine).  - You have HIV or AIDS.  - You use needles to inject street drugs.  - You live with, or have sex with, someone who has Hepatitis B.  - You get hemodialysis treatment.  - You take certain medicines for conditions like cancer, organ  transplantation, and autoimmune conditions.   Hepatitis C blood testing is recommended for all people born from [birth year] through 1965 and any individual  with known risks for hepatitis C.   Practice safe sex. Use condoms and avoid high-risk sexual practices to reduce the spread of sexually transmitted infections (STIs). STIs include gonorrhea, chlamydia, syphilis, trichomonas, herpes, HPV, and human immunodeficiency virus (HIV). Herpes, HIV, and HPV are viral illnesses that have no cure. They can result in disability, cancer, and death. Sexually active women aged 25 years and younger should be checked for chlamydia. Older women with new or multiple partners should also be tested for chlamydia. Testing for other STIs is recommended if you are sexually active and at increased risk.   Osteoporosis is a disease in which the bones lose minerals and strength with aging. This can result in serious bone fractures or breaks. The risk of osteoporosis can be identified using a bone density scan. Women ages 65 years and over and women at risk for fractures or osteoporosis should discuss screening with their health care providers. Ask your health care provider whether you should take a calcium supplement or vitamin D to There are also several preventive steps women can take to avoid osteoporosis and resulting fractures or to keep osteoporosis from worsening. -->Recommendations include:  Eat a balanced diet high in fruits, vegetables, calcium, and vitamins.  Get enough calcium. The recommended total intake of is 1,200 mg daily; for best absorption, if taking supplements, divide doses into 250-500 mg doses throughout the day. Of the two types of calcium, calcium carbonate is best absorbed when taken with food but calcium citrate can be taken on an empty stomach.  Get enough vitamin D. NAMS and the National Osteoporosis Foundation recommend at least 1,000 IU per day for women age 50 and over who are at risk of vitamin D  deficiency. Vitamin D deficiency can be caused by inadequate sun exposure (for example, those who live in northern latitudes).  Avoid alcohol and smoking. Heavy alcohol intake (more than 7 drinks per week) increases the risk of falls and hip fracture and women smokers tend to lose bone more rapidly and have lower bone mass than nonsmokers. Stopping smoking is one of the most important changes women can make to improve their health and decrease risk for disease.  Be physically active every day. Weight-bearing exercise (for example, fast walking, hiking, jogging, and weight training) may strengthen bones or slow the rate of bone loss that comes with aging. Balancing and muscle-strengthening exercises can reduce the risk of falling and fracture.  Consider therapeutic medications. Currently, several types of effective drugs are available. Healthcare providers can recommend the type most appropriate for each woman.  Eliminate environmental factors that may contribute to accidents. Falls cause nearly 90% of all osteoporotic fractures, so reducing this risk is an important bone-health strategy. Measures include ample lighting, removing obstructions to walking, using nonskid rugs on floors, and placing mats and/or grab bars in showers.  Be aware of medication side effects. Some common medicines make bones weaker. These include a type of steroid drug called glucocorticoids used for arthritis and asthma, some antiseizure drugs, certain sleeping pills, treatments for endometriosis, and some cancer drugs. An overactive thyroid gland or using too much thyroid hormone for an underactive thyroid can also be a problem. If you are taking these medicines, talk to your doctor about what you can do to help protect your bones.reduce the rate of osteoporosis.    Menopause can be associated with physical symptoms and risks. Hormone replacement therapy is available to decrease symptoms and risks. You should talk to your  health care provider   about whether hormone replacement therapy is right for you.   Use sunscreen. Apply sunscreen liberally and repeatedly throughout the day. You should seek shade when your shadow is shorter than you. Protect yourself by wearing long sleeves, pants, a wide-brimmed hat, and sunglasses year round, whenever you are outdoors.   Once a month, do a whole body skin exam, using a mirror to look at the skin on your back. Tell your health care provider of new moles, moles that have irregular borders, moles that are larger than a pencil eraser, or moles that have changed in shape or color.   -Stay current with required vaccines (immunizations).   Influenza vaccine. All adults should be immunized every year.  Tetanus, diphtheria, and acellular pertussis (Td, Tdap) vaccine. Pregnant women should receive 1 dose of Tdap vaccine during each pregnancy. The dose should be obtained regardless of the length of time since the last dose. Immunization is preferred during the 27th 36th week of gestation. An adult who has not previously received Tdap or who does not know her vaccine status should receive 1 dose of Tdap. This initial dose should be followed by tetanus and diphtheria toxoids (Td) booster doses every 10 years. Adults with an unknown or incomplete history of completing a 3-dose immunization series with Td-containing vaccines should begin or complete a primary immunization series including a Tdap dose. Adults should receive a Td booster every 10 years.  Varicella vaccine. An adult without evidence of immunity to varicella should receive 2 doses or a second dose if she has previously received 1 dose. Pregnant females who do not have evidence of immunity should receive the first dose after pregnancy. This first dose should be obtained before leaving the health care facility. The second dose should be obtained 4 8 weeks after the first dose.  Human papillomavirus (HPV) vaccine. Females aged 13 26  years who have not received the vaccine previously should obtain the 3-dose series. The vaccine is not recommended for use in pregnant females. However, pregnancy testing is not needed before receiving a dose. If a female is found to be pregnant after receiving a dose, no treatment is needed. In that case, the remaining doses should be delayed until after the pregnancy. Immunization is recommended for any person with an immunocompromised condition through the age of 26 years if she did not get any or all doses earlier. During the 3-dose series, the second dose should be obtained 4 8 weeks after the first dose. The third dose should be obtained 24 weeks after the first dose and 16 weeks after the second dose.  Zoster vaccine. One dose is recommended for adults aged 60 years or older unless certain conditions are present.  Measles, mumps, and rubella (MMR) vaccine. Adults born before 1957 generally are considered immune to measles and mumps. Adults born in 1957 or later should have 1 or more doses of MMR vaccine unless there is a contraindication to the vaccine or there is laboratory evidence of immunity to each of the three diseases. A routine second dose of MMR vaccine should be obtained at least 28 days after the first dose for students attending postsecondary schools, health care workers, or international travelers. People who received inactivated measles vaccine or an unknown type of measles vaccine during 1963 1967 should receive 2 doses of MMR vaccine. People who received inactivated mumps vaccine or an unknown type of mumps vaccine before 1979 and are at high risk for mumps infection should consider immunization with 2 doses of   MMR vaccine. For females of childbearing age, rubella immunity should be determined. If there is no evidence of immunity, females who are not pregnant should be vaccinated. If there is no evidence of immunity, females who are pregnant should delay immunization until after pregnancy.  Unvaccinated health care workers born before [birth year] who lack laboratory evidence of measles, mumps, or rubella immunity or laboratory confirmation of disease should consider measles and mumps immunization with 2 doses of MMR vaccine or rubella immunization with 1 dose of MMR vaccine.  Pneumococcal 13-valent conjugate (PCV13) vaccine. When indicated, a person who is uncertain of her immunization history and has no record of immunization should receive the PCV13 vaccine. An adult aged 54 years or older who has certain medical conditions and has not been previously immunized should receive 1 dose of PCV13 vaccine. This PCV13 should be followed with a dose of pneumococcal polysaccharide (PPSV23) vaccine. The PPSV23 vaccine dose should be obtained at least 8 weeks after the dose of PCV13 vaccine. An adult aged 58 years or older who has certain medical conditions and previously received 1 or more doses of PPSV23 vaccine should receive 1 dose of PCV13. The PCV13 vaccine dose should be obtained 1 or more years after the last PPSV23 vaccine dose.  Pneumococcal polysaccharide (PPSV23) vaccine. When PCV13 is also indicated, PCV13 should be obtained first. All adults aged 58 years and older should be immunized. An adult younger than age 65 years who has certain medical conditions should be immunized. Any person who resides in a nursing home or long-term care facility should be immunized. An adult smoker should be immunized. People with an immunocompromised condition and certain other conditions should receive both PCV13 and PPSV23 vaccines. People with human immunodeficiency virus (HIV) infection should be immunized as soon as possible after diagnosis. Immunization during chemotherapy or radiation therapy should be avoided. Routine use of PPSV23 vaccine is not recommended for American Indians, [place] Natives, or people younger than 65 years unless there are medical conditions that require PPSV23 vaccine. When indicated,  people who have unknown immunization and have no record of immunization should receive PPSV23 vaccine. One-time revaccination 5 years after the first dose of PPSV23 is recommended for people aged 70 64 years who have chronic kidney failure, nephrotic syndrome, asplenia, or immunocompromised conditions. People who received 1 2 doses of PPSV23 before age 32 years should receive another dose of PPSV23 vaccine at age 96 years or later if at least 5 years have passed since the previous dose. Doses of PPSV23 are not needed for people immunized with PPSV23 at or after age 55 years.  Meningococcal vaccine. Adults with asplenia or persistent complement component deficiencies should receive 2 doses of quadrivalent meningococcal conjugate (MenACWY-D) vaccine. The doses should be obtained at least 2 months apart. Microbiologists working with certain meningococcal bacteria, [employer] recruits, people at risk during an outbreak, and people who travel to or live in countries with a high rate of meningitis should be immunized. A first-year college student up through age 58 years who is living in a residence hall should receive a dose if she did not receive a dose on or after her 16th birthday. Adults who have certain high-risk conditions should receive one or more doses of vaccine.  Hepatitis A vaccine. Adults who wish to be protected from this disease, have certain high-risk conditions, work with hepatitis A-infected animals, work in hepatitis A research labs, or travel to or work in countries with a high rate of hepatitis A should be  immunized. Adults who were previously unvaccinated and who anticipate close contact with an international adoptee during the first 60 days after arrival in the [place] from a country with a high rate of hepatitis A should be immunized.  Hepatitis B vaccine.  Adults who wish to be protected from this disease, have certain high-risk conditions, may be exposed to blood or other infectious  body fluids, are household contacts or sex partners of hepatitis B positive people, are clients or workers in certain care facilities, or travel to or work in countries with a high rate of hepatitis B should be immunized.  Haemophilus influenzae type b (Hib) vaccine. A previously unvaccinated person with asplenia or sickle cell disease or having a scheduled splenectomy should receive 1 dose of Hib vaccine. Regardless of previous immunization, a recipient of a hematopoietic stem cell transplant should receive a 3-dose series 6 12 months after her successful transplant. Hib vaccine is not recommended for adults with HIV infection.  Preventive Services / Frequency Ages 6 to 39years  Blood pressure check.** / Every 1 to 2 years.  Lipid and cholesterol check.** / Every 5 years beginning at age 39.  Clinical breast exam.** / Every 3 years for women in their 61s and 62s.  BRCA-related cancer risk assessment.** / For women who have family members with a BRCA-related cancer (breast, ovarian, tubal, or peritoneal cancers).  Pap test.** / Every 2 years from ages 47 through 85. Every 3 years starting at age 34 through age 12 or 74 with a history of 3 consecutive normal Pap tests.  HPV screening.** / Every 3 years from ages 46 through ages 43 to 54 with a history of 3 consecutive normal Pap tests.  Hepatitis C blood test.** / For any individual with known risks for hepatitis C.  Skin self-exam. / Monthly.  Influenza vaccine. / Every year.  Tetanus, diphtheria, and acellular pertussis (Tdap, Td) vaccine.** / Consult your health care provider. Pregnant women should receive 1 dose of Tdap vaccine during each pregnancy. 1 dose of Td every 10 years.  Varicella vaccine.** / Consult your health care provider. Pregnant females who do not have evidence of immunity should receive the first dose after pregnancy.  HPV vaccine. / 3 doses over 6 months, if 64 and younger. The vaccine is not recommended for use in  pregnant females. However, pregnancy testing is not needed before receiving a dose.  Measles, mumps, rubella (MMR) vaccine.** / You need at least 1 dose of MMR if you were born in 1957 or later. You may also need a 2nd dose. For females of childbearing age, rubella immunity should be determined. If there is no evidence of immunity, females who are not pregnant should be vaccinated. If there is no evidence of immunity, females who are pregnant should delay immunization until after pregnancy.  Pneumococcal 13-valent conjugate (PCV13) vaccine.** / Consult your health care provider.  Pneumococcal polysaccharide (PPSV23) vaccine.** / 1 to 2 doses if you smoke cigarettes or if you have certain conditions.  Meningococcal vaccine.** / 1 dose if you are age 71 to 37 years and a Market researcher living in a residence hall, or have one of several medical conditions, you need to get vaccinated against meningococcal disease. You may also need additional booster doses.  Hepatitis A vaccine.** / Consult your health care provider.  Hepatitis B vaccine.** / Consult your health care provider.  Haemophilus influenzae type b (Hib) vaccine.** / Consult your health care provider.  Ages 55 to 64years  Blood pressure check.** / Every 1 to 2 years.  Lipid and cholesterol check.** / Every 5 years beginning at age 20 years.  Lung cancer screening. / Every year if you are aged 55 80 years and have a 30-pack-year history of smoking and currently smoke or have quit within the past 15 years. Yearly screening is stopped once you have quit smoking for at least 15 years or develop a health problem that would prevent you from having lung cancer treatment.  Clinical breast exam.** / Every year after age 40 years.  BRCA-related cancer risk assessment.** / For women who have family members with a BRCA-related cancer (breast, ovarian, tubal, or peritoneal cancers).  Mammogram.** / Every year beginning at age 40  years and continuing for as long as you are in good health. Consult with your health care provider.  Pap test.** / Every 3 years starting at age 30 years through age 65 or 70 years with a history of 3 consecutive normal Pap tests.  HPV screening.** / Every 3 years from ages 30 years through ages 65 to 70 years with a history of 3 consecutive normal Pap tests.  Fecal occult blood test (FOBT) of stool. / Every year beginning at age 50 years and continuing until age 75 years. You may not need to do this test if you get a colonoscopy every 10 years.  Flexible sigmoidoscopy or colonoscopy.** / Every 5 years for a flexible sigmoidoscopy or every 10 years for a colonoscopy beginning at age 50 years and continuing until age 75 years.  Hepatitis C blood test.** / For all people born from 1945 through 1965 and any individual with known risks for hepatitis C.  Skin self-exam. / Monthly.  Influenza vaccine. / Every year.  Tetanus, diphtheria, and acellular pertussis (Tdap/Td) vaccine.** / Consult your health care provider. Pregnant women should receive 1 dose of Tdap vaccine during each pregnancy. 1 dose of Td every 10 years.  Varicella vaccine.** / Consult your health care provider. Pregnant females who do not have evidence of immunity should receive the first dose after pregnancy.  Zoster vaccine.** / 1 dose for adults aged 60 years or older.  Measles, mumps, rubella (MMR) vaccine.** / You need at least 1 dose of MMR if you were born in 1957 or later. You may also need a 2nd dose. For females of childbearing age, rubella immunity should be determined. If there is no evidence of immunity, females who are not pregnant should be vaccinated. If there is no evidence of immunity, females who are pregnant should delay immunization until after pregnancy.  Pneumococcal 13-valent conjugate (PCV13) vaccine.** / Consult your health care provider.  Pneumococcal polysaccharide (PPSV23) vaccine.** / 1 to 2 doses if  you smoke cigarettes or if you have certain conditions.  Meningococcal vaccine.** / Consult your health care provider.  Hepatitis A vaccine.** / Consult your health care provider.  Hepatitis B vaccine.** / Consult your health care provider.  Haemophilus influenzae type b (Hib) vaccine.** / Consult your health care provider.  Ages 65 years and over  Blood pressure check.** / Every 1 to 2 years.  Lipid and cholesterol check.** / Every 5 years beginning at age 20 years.  Lung cancer screening. / Every year if you are aged 55 80 years and have a 30-pack-year history of smoking and currently smoke or have quit within the past 15 years. Yearly screening is stopped once you have quit smoking for at least 15 years or develop a health problem that   would prevent you from having lung cancer treatment.  Clinical breast exam.** / Every year after age 103 years.  BRCA-related cancer risk assessment.** / For women who have family members with a BRCA-related cancer (breast, ovarian, tubal, or peritoneal cancers).  Mammogram.** / Every year beginning at age 36 years and continuing for as long as you are in good health. Consult with your health care provider.  Pap test.** / Every 3 years starting at age 5 years through age 85 or 10 years with 3 consecutive normal Pap tests. Testing can be stopped between 65 and 70 years with 3 consecutive normal Pap tests and no abnormal Pap or HPV tests in the past 10 years.  HPV screening.** / Every 3 years from ages 93 years through ages 70 or 45 years with a history of 3 consecutive normal Pap tests. Testing can be stopped between 65 and 70 years with 3 consecutive normal Pap tests and no abnormal Pap or HPV tests in the past 10 years.  Fecal occult blood test (FOBT) of stool. / Every year beginning at age 8 years and continuing until age 45 years. You may not need to do this test if you get a colonoscopy every 10 years.  Flexible sigmoidoscopy or colonoscopy.** /  Every 5 years for a flexible sigmoidoscopy or every 10 years for a colonoscopy beginning at age 69 years and continuing until age 68 years.  Hepatitis C blood test.** / For all people born from [birth year] through 1965 and any individual with known risks for hepatitis C.  Osteoporosis screening.** / A one-time screening for women ages 7 years and over and women at risk for fractures or osteoporosis.  Skin self-exam. / Monthly.  Influenza vaccine. / Every year.  Tetanus, diphtheria, and acellular pertussis (Tdap/Td) vaccine.** / 1 dose of Td every 10 years.  Varicella vaccine.** / Consult your health care provider.  Zoster vaccine.** / 1 dose for adults aged 5 years or older.  Pneumococcal 13-valent conjugate (PCV13) vaccine.** / Consult your health care provider.  Pneumococcal polysaccharide (PPSV23) vaccine.** / 1 dose for all adults aged 74 years and older.  Meningococcal vaccine.** / Consult your health care provider.  Hepatitis A vaccine.** / Consult your health care provider.  Hepatitis B vaccine.** / Consult your health care provider.  Haemophilus influenzae type b (Hib) vaccine.** / Consult your health care provider. ** Family history and personal history of risk and conditions may change your health care provider's recommendations. Document Released: 02/28/2001 Document Revised: 10/23/2012  [facility] Patient Information 2014 [place], [place].   EXERCISE AND DIET:  We recommended that you start or continue a regular exercise program for good health. Regular exercise means any activity that makes your heart beat faster and makes you sweat.  We recommend exercising at least 30 minutes per day at least 3 days a week, preferably 5.  We also recommend a diet low in fat and sugar / carbohydrates.  Inactivity, poor dietary choices and obesity can cause diabetes, heart attack, stroke, and kidney damage, among others.     ALCOHOL AND SMOKING:  Women should limit their alcohol intake to no  more than 7 drinks/beers/glasses of wine (combined, not each!) per week. Moderation of alcohol intake to this level decreases your risk of breast cancer and liver damage.  ( And of course, no recreational drugs are part of a healthy lifestyle.)  Also, you should not be smoking at all or even being exposed to second hand smoke. Most people know smoking can  cause cancer, and various heart and lung diseases, but did you know it also contributes to weakening of your bones?  Aging of your skin?  Yellowing of your teeth and nails?   CALCIUM AND VITAMIN D:  Adequate intake of calcium and Vitamin D are recommended.  The recommendations for exact amounts of these supplements seem to change often, but generally speaking 600 mg of calcium (either carbonate or citrate) and 800 units of Vitamin D per day seems prudent. Certain women may benefit from higher intake of Vitamin D.  If you are among these women, your doctor will have told you during your visit.     PAP SMEARS:  Pap smears, to check for cervical cancer or precancers,  have traditionally been done yearly, although recent scientific advances have shown that most women can have pap smears less often.  However, every woman still should have a physical exam from her gynecologist or primary care physician every year. It will include a breast check, inspection of the vulva and vagina to check for abnormal growths or skin changes, a visual exam of the cervix, and then an exam to evaluate the size and shape of the uterus and ovaries.  And after 60 years of age, a rectal exam is indicated to check for rectal cancers. We will also provide age appropriate advice regarding health maintenance, like when you should have certain vaccines, screening for sexually transmitted diseases, bone density testing, colonoscopy, mammograms, etc.    MAMMOGRAMS:  All women over 29 years old should have a yearly mammogram. Many facilities now offer a "3D" mammogram, which may cost  around $50 extra out of pocket. If possible,  we recommend you accept the option to have the 3D mammogram performed.  It both reduces the number of women who will be called back for extra views which then turn out to be normal, and it is better than the routine mammogram at detecting truly abnormal areas.     COLONOSCOPY:  Colonoscopy to screen for colon cancer is recommended for all women at age 15.  We know, you hate the idea of the prep.  We agree, BUT, having colon cancer and not knowing it is worse!!  Colon cancer so often starts as a polyp that can be seen and removed at colonscopy, which can quite literally save your life!  And if your first colonoscopy is normal and you have no family history of colon cancer, most women don't have to have it again for 10 years.  Once every ten years, you can do something that may end up saving your life, right?  We will be happy to help you get it scheduled when you are ready.  Be sure to check your insurance coverage so you understand how much it will cost.  It may be covered as a preventative service at no cost, but you should check your particular policy.   Increase water intake, strive for at least half of your body weight in oz/day. Follow Heart Healthy diet Increase regular exercise.  Recommend at least 30 minutes daily, 5 days per week of walking, jogging, biking, swimming, YouTube/Pinterest workout videos. Continue all medications as directed. We will call you when lab results are available. Please call your insurance company, re: colonoscopy procedure coverage. Follow-up in 6 months.

## 2017-11-29 NOTE — Telephone Encounter (Signed)
Pt states while in for OV 11/14 forgot to ask for Rx refill on :  atorvastatin (LIPITOR) 10 MG tablet [[id number]]   Order Details  Dose: 10 mg Route: Oral Frequency: Daily  Dispense Quantity: 90 tablet Refills: 3 Fills remaining: --        Sig: Take 1 tablet (10 mg total) daily by mouth.     Please send to:  [facility] ([address]), [place] [phone] (Phone) [phone] (Fax)   --Forwarding message to medical assistant. --Dion Body

## 2017-11-29 NOTE — Assessment & Plan Note (Signed)
Lipids drawn today Currently on atorvastatin 10mg  QD, Lovaza 1g- tolerating well Encouraged to increase regular exercise

## 2017-11-29 NOTE — Assessment & Plan Note (Signed)
Increase water intake, strive for at least half of your body weight in oz/day. Follow Heart Healthy diet Increase regular exercise.  Recommend at least 30 minutes daily, 5 days per week of walking, jogging, biking, swimming, YouTube/Pinterest workout videos. Continue all medications as directed. We will call you when lab results are available. Please call your insurance company, re: colonoscopy procedure coverage. Follow-up in 6 months.

## 2017-11-30 LAB — LIPID PANEL
CHOL/HDL RATIO: 4.5 ratio — AB (ref 0.0–4.4)
Cholesterol, Total: 204 mg/dL — ABNORMAL HIGH (ref 100–199)
HDL: 45 mg/dL (ref >39)
LDL CALC: 134 mg/dL — AB (ref 0–99)
TRIGLYCERIDES: 127 mg/dL (ref 0–149)
VLDL CHOLESTEROL CAL: 25 mg/dL (ref 5–40)

## 2017-11-30 LAB — CBC WITH DIFFERENTIAL/PLATELET
BASOS ABS: 0.1 10*3/uL (ref 0.0–0.2)
Basos: 1 %
EOS (ABSOLUTE): 0.5 10*3/uL — ABNORMAL HIGH (ref 0.0–0.4)
Eos: 10 %
HEMOGLOBIN: 13.1 g/dL (ref 11.1–15.9)
Hematocrit: 39.4 % (ref 34.0–46.6)
IMMATURE GRANS (ABS): 0 10*3/uL (ref 0.0–0.1)
IMMATURE GRANULOCYTES: 1 %
LYMPHS: 24 %
Lymphocytes Absolute: 1.3 10*3/uL (ref 0.7–3.1)
MCH: 28.9 pg (ref 26.6–33.0)
MCHC: 33.2 g/dL (ref 31.5–35.7)
MCV: 87 fL (ref 79–97)
MONOCYTES: 7 %
Monocytes Absolute: 0.4 10*3/uL (ref 0.1–0.9)
NEUTROS PCT: 57 %
Neutrophils Absolute: 3.1 10*3/uL (ref 1.4–7.0)
Platelets: 256 10*3/uL (ref 150–450)
RBC: 4.53 x10E6/uL (ref 3.77–5.28)
RDW: 12.5 % (ref 12.3–15.4)
WBC: 5.3 10*3/uL (ref 3.4–10.8)

## 2017-11-30 LAB — COMPREHENSIVE METABOLIC PANEL
ALBUMIN: 4.3 g/dL (ref 3.6–4.8)
ALK PHOS: 72 IU/L (ref 39–117)
ALT: 25 IU/L (ref 0–32)
AST: 26 IU/L (ref 0–40)
Albumin/Globulin Ratio: 1.8 (ref 1.2–2.2)
BUN / CREAT RATIO: 13 (ref 12–28)
BUN: 10 mg/dL (ref 8–27)
Bilirubin Total: 0.5 mg/dL (ref 0.0–1.2)
CALCIUM: 9.1 mg/dL (ref 8.7–10.3)
CO2: 25 mmol/L (ref 20–29)
CREATININE: 0.78 mg/dL (ref 0.57–1.00)
Chloride: 102 mmol/L (ref 96–106)
GFR calc Af Amer: 96 mL/min/{1.73_m2} (ref >59)
GFR calc non Af Amer: 83 mL/min/{1.73_m2} (ref >59)
GLUCOSE: 102 mg/dL — AB (ref 65–99)
Globulin, Total: 2.4 g/dL (ref 1.5–4.5)
Potassium: 4.3 mmol/L (ref 3.5–5.2)
Sodium: 143 mmol/L (ref 134–144)
TOTAL PROTEIN: 6.7 g/dL (ref 6.0–8.5)

## 2017-11-30 LAB — HEMOGLOBIN A1C
Est. average glucose Bld gHb Est-mCnc: 117 mg/dL
HEMOGLOBIN A1C: 5.7 % — AB (ref 4.8–5.6)

## 2017-11-30 LAB — HIV ANTIBODY (ROUTINE TESTING W REFLEX): HIV SCREEN 4TH GENERATION: NONREACTIVE

## 2017-11-30 LAB — HEPATITIS C ANTIBODY: Hep C Virus Ab: <0.1 s/co ratio (ref 0.0–0.9)

## 2018-05-27 ENCOUNTER — Other Ambulatory Visit: Payer: Self-pay | Admitting: Gynecology

## 2018-05-27 DIAGNOSIS — Z1231 Encounter for screening mammogram for malignant neoplasm of breast: Secondary | ICD-10-CM

## 2018-05-28 ENCOUNTER — Other Ambulatory Visit: Payer: Self-pay

## 2018-05-28 ENCOUNTER — Other Ambulatory Visit: Payer: BLUE CROSS/BLUE SHIELD

## 2018-05-28 ENCOUNTER — Ambulatory Visit: Payer: BLUE CROSS/BLUE SHIELD | Admitting: Adult Health

## 2018-05-28 DIAGNOSIS — E785 Hyperlipidemia, unspecified: Secondary | ICD-10-CM

## 2018-05-30 ENCOUNTER — Ambulatory Visit: Payer: BLUE CROSS/BLUE SHIELD | Admitting: Adult Health

## 2018-07-17 ENCOUNTER — Ambulatory Visit: Payer: BLUE CROSS/BLUE SHIELD

## 2018-10-15 ENCOUNTER — Encounter: Payer: Self-pay | Admitting: Gynecology

## 2019-03-15 ENCOUNTER — Ambulatory Visit: Payer: BLUE CROSS/BLUE SHIELD

## 2019-03-18 ENCOUNTER — Ambulatory Visit: Payer: BLUE CROSS/BLUE SHIELD | Attending: Internal Medicine

## 2019-03-18 DIAGNOSIS — Z20822 Contact with and (suspected) exposure to covid-19: Secondary | ICD-10-CM

## 2019-03-19 LAB — NOVEL CORONAVIRUS, NAA: SARS-CoV-2, NAA: NOT DETECTED

## 2019-06-17 ENCOUNTER — Other Ambulatory Visit: Payer: Self-pay

## 2019-06-18 ENCOUNTER — Encounter: Payer: Self-pay | Admitting: Nurse Practitioner

## 2019-06-18 ENCOUNTER — Ambulatory Visit (INDEPENDENT_AMBULATORY_CARE_PROVIDER_SITE_OTHER): Payer: No Typology Code available for payment source | Admitting: Nurse Practitioner

## 2019-06-18 ENCOUNTER — Other Ambulatory Visit: Payer: Self-pay

## 2019-06-18 VITALS — BP 136/80 | Wt 194.0 lb

## 2019-06-18 DIAGNOSIS — Z780 Asymptomatic menopausal state: Secondary | ICD-10-CM

## 2019-06-18 DIAGNOSIS — Z01419 Encounter for gynecological examination (general) (routine) without abnormal findings: Secondary | ICD-10-CM | POA: Diagnosis not present

## 2019-06-18 NOTE — Patient Instructions (Signed)
Health Maintenance, Female Adopting a healthy lifestyle and getting preventive care are important in promoting health and wellness. Ask your health care provider about:  The right schedule for you to have regular tests and exams.  Things you can do on your own to prevent diseases and keep yourself healthy. What should I know about diet, weight, and exercise? Eat a healthy diet   Eat a diet that includes plenty of vegetables, fruits, low-fat dairy products, and lean protein.  Do not eat a lot of foods that are high in solid fats, added sugars, or sodium. Maintain a healthy weight Body mass index (BMI) is used to identify weight problems. It estimates body fat based on height and weight. Your health care provider can help determine your BMI and help you achieve or maintain a healthy weight. Get regular exercise Get regular exercise. This is one of the most important things you can do for your health. Most adults should:  Exercise for at least 150 minutes each week. The exercise should increase your heart rate and make you sweat (moderate-intensity exercise).  Do strengthening exercises at least twice a week. This is in addition to the moderate-intensity exercise.  Spend less time sitting. Even light physical activity can be beneficial. Watch cholesterol and blood lipids Have your blood tested for lipids and cholesterol at 62 years of age, then have this test every 5 years. Have your cholesterol levels checked more often if:  Your lipid or cholesterol levels are high.  You are older than 62 years of age.  You are at high risk for heart disease. What should I know about cancer screening? Depending on your health history and family history, you may need to have cancer screening at various ages. This may include screening for:  Breast cancer.  Cervical cancer.  Colorectal cancer.  Skin cancer.  Lung cancer. What should I know about heart disease, diabetes, and high blood  pressure? Blood pressure and heart disease  High blood pressure causes heart disease and increases the risk of stroke. This is more likely to develop in people who have high blood pressure readings, are of African descent, or are overweight.  Have your blood pressure checked: ? Every 3-5 years if you are 18-39 years of age. ? Every year if you are 40 years old or older. Diabetes Have regular diabetes screenings. This checks your fasting blood sugar level. Have the screening done:  Once every three years after age 40 if you are at a normal weight and have a low risk for diabetes.  More often and at a younger age if you are overweight or have a high risk for diabetes. What should I know about preventing infection? Hepatitis B If you have a higher risk for hepatitis B, you should be screened for this virus. Talk with your health care provider to find out if you are at risk for hepatitis B infection. Hepatitis C Testing is recommended for:  Everyone born from 1945 through 1965.  Anyone with known risk factors for hepatitis C. Sexually transmitted infections (STIs)  Get screened for STIs, including gonorrhea and chlamydia, if: ? You are sexually active and are younger than 62 years of age. ? You are older than 62 years of age and your health care provider tells you that you are at risk for this type of infection. ? Your sexual activity has changed since you were last screened, and you are at increased risk for chlamydia or gonorrhea. Ask your health care provider if   you are at risk.  Ask your health care provider about whether you are at high risk for HIV. Your health care provider may recommend a prescription medicine to help prevent HIV infection. If you choose to take medicine to prevent HIV, you should first get tested for HIV. You should then be tested every 3 months for as long as you are taking the medicine. Pregnancy  If you are about to stop having your period (premenopausal) and  you may become pregnant, seek counseling before you get pregnant.  Take 400 to 800 micrograms (mcg) of folic acid every day if you become pregnant.  Ask for birth control (contraception) if you want to prevent pregnancy. Osteoporosis and menopause Osteoporosis is a disease in which the bones lose minerals and strength with aging. This can result in bone fractures. If you are 65 years old or older, or if you are at risk for osteoporosis and fractures, ask your health care provider if you should:  Be screened for bone loss.  Take a calcium or vitamin D supplement to lower your risk of fractures.  Be given hormone replacement therapy (HRT) to treat symptoms of menopause. Follow these instructions at home: Lifestyle  Do not use any products that contain nicotine or tobacco, such as cigarettes, e-cigarettes, and chewing tobacco. If you need help quitting, ask your health care provider.  Do not use street drugs.  Do not share needles.  Ask your health care provider for help if you need support or information about quitting drugs. Alcohol use  Do not drink alcohol if: ? Your health care provider tells you not to drink. ? You are pregnant, may be pregnant, or are planning to become pregnant.  If you drink alcohol: ? Limit how much you use to 0-1 drink a day. ? Limit intake if you are breastfeeding.  Be aware of how much alcohol is in your drink. In the [place] one drink equals one 12 oz bottle of beer (355 mL), one 5 oz glass of wine (148 mL), or one 1 oz glass of hard liquor (44 mL). General instructions  Schedule regular health, dental, and eye exams.  Stay current with your vaccines.  Tell your health care provider if: ? You often feel depressed. ? You have ever been abused or do not feel safe at home. Summary  Adopting a healthy lifestyle and getting preventive care are important in promoting health and wellness.  Follow your health care provider's instructions about healthy  diet, exercising, and getting tested or screened for diseases.  Follow your health care provider's instructions on monitoring your cholesterol and blood pressure. This information is not intended to replace advice given to you by your health care provider. Make sure you discuss any questions you have with your health care provider. Document Revised: 12/26/2017 Document Reviewed: 12/26/2017 Elsevier Patient Education  2020 [employer]  

## 2019-06-18 NOTE — Progress Notes (Signed)
   Veronica Leonard [birth year] [id number]   History:  64 y.o. G3 P3 presents for annual exam.  Normal mammogram and Pap history.  Postmenopausal -no HRT, no bleeding.  Reports mild RLQ cramping that happens randomly and occurs every couple of months but recently seems to be monthly.  Is unsure if this could be her bowels.  Labs done with PCP.  Gynecologic History Patient's last menstrual period was 09/14/2012.   Contraception: post menopausal status Last Pap: 06/20/2017. Results were: normal Last mammogram: 07/09/2018. Results were: normal Last colonoscopy: 09/18/2007. Results were: hemorrhoids, polyp. 10 year repeat   Past medical history, past surgical history, family history and social history were all reviewed and documented in the EPIC chart.  ROS:  A ROS was performed and pertinent positives and negatives are included.  Exam:  Vitals:   06/18/19 1122  BP: 136/80  Weight: 194 lb (88 kg)   Body mass index is 35.48 kg/m.  General appearance:  Normal Thyroid:  Symmetrical, normal in size, without palpable masses or nodularity. Respiratory  Auscultation:  Clear without wheezing or rhonchi Cardiovascular  Auscultation:  Regular rate, without rubs, murmurs or gallops  Edema/varicosities:  Not grossly evident Abdominal  Soft,nontender, without masses, guarding or rebound.  Liver/spleen:  No organomegaly noted  Hernia:  None appreciated  Skin  Inspection:  Grossly normal   Breasts: Examined lying and sitting.   Right: Without masses, retractions, discharge or axillary adenopathy.   Left: Without masses, retractions, discharge or axillary adenopathy. Gentitourinary   Inguinal/mons:  Normal without inguinal adenopathy  External genitalia:  Normal  BUS/Urethra/Skene's glands:  Normal  Vagina:  Normal, atrophic changes  Cervix:  Normal  Uterus:  Normal in size, shape and contour.  Midline and mobile  Adnexa/parametria:     Rt: Without masses or tenderness.   Lt: Without  masses or tenderness.  Anus and perineum: Normal  Digital rectal exam: Normal sphincter tone without palpated masses or tenderness  Assessment/Plan:  64 y.o.  for annual exam.    Well female exam with routine gynecological exam - Education provided on SBEs, importance of preventative screenings, current guidelines, high calcium diet, regular exercise, and multivitamin daily. Instructed to keep track of abdominal cramping and if it continues and does not appear to be related to gas/bowel movements we will do a pelvic ultrasound. She is agreeable to plan.   Postmenopausal - no HRT, no bleeding. Minimal symptoms  Follow-up in 1 year for annual    [place], 11:28 AM 06/18/2019

## 2019-09-10 ENCOUNTER — Ambulatory Visit: Payer: BLUE CROSS/BLUE SHIELD | Admitting: Physician Assistant

## 2020-03-08 ENCOUNTER — Ambulatory Visit (INDEPENDENT_AMBULATORY_CARE_PROVIDER_SITE_OTHER): Payer: 59 | Admitting: Nurse Practitioner

## 2020-03-08 ENCOUNTER — Other Ambulatory Visit: Payer: Self-pay

## 2020-03-08 ENCOUNTER — Encounter: Payer: Self-pay | Admitting: Physician Assistant

## 2020-03-08 VITALS — BP 115/76 | HR 74 | Temp 98.7°F | Ht 62.6 in | Wt 199.5 lb

## 2020-03-08 DIAGNOSIS — E782 Mixed hyperlipidemia: Secondary | ICD-10-CM | POA: Diagnosis not present

## 2020-03-08 DIAGNOSIS — J302 Other seasonal allergic rhinitis: Secondary | ICD-10-CM | POA: Diagnosis not present

## 2020-03-08 DIAGNOSIS — H6122 Impacted cerumen, left ear: Secondary | ICD-10-CM | POA: Diagnosis not present

## 2020-03-08 DIAGNOSIS — Z7689 Persons encountering health services in other specified circumstances: Secondary | ICD-10-CM | POA: Insufficient documentation

## 2020-03-08 NOTE — Patient Instructions (Signed)

## 2020-03-08 NOTE — Progress Notes (Signed)
New Patient Office Visit  Subjective:  Patient ID: Veronica Leonard, female    DOB: [birth year]  Age: 65 y.o. MRN: [id number]  CC:  Chief Complaint  Patient presents with  . New Patient (Initial Visit)  . Cerumen Impaction    Bilateral      HPI Veronica Leonard presents for establishment of primary care provider. She has been seeing [facility] for past two years. She is switching back to this practice due to insurance and personal preference. Today, she does c/o wax build up in both ears. She states that this has been a chronic and intermittent problem. She state she last had to have ears cleaned out in August 2021. She denies symptoms such as vertigo, ear pain, ringing in the ears, or ear pain. She does have some itching in the ears. She states she has been using small amount of mineral oil every other day. She will also clean her ears with Debrox and gentle lavage around the last day of every month. She feels like this regimen has really helped to keep wax build up to a minimum.  Has history of hyperlipidemia. Last labs were drawn at [facility] on 11/18/2019. Her total was 221, triglycerides were 205, HDL 50, LDL 150, and total/HDL ratio was 4.4. making her average risk for future CAD. Family history is positive for CAD with two of her older sisters having stent placements. The patient does take lipitor 10mg  every evening. Her LFTs 11/18/2019 were normal.   Past Medical History:  Diagnosis Date  . Anxiety    SITUATIONAL  . Hyperlipidemia   . Seasonal allergies     History reviewed. No pertinent surgical history.  Family History  Problem Relation Age of Onset  . Diabetes Father   . Hypertension Sister   . Heart disease Sister   . Heart murmur Sister   . Diabetes Brother   . Heart murmur Brother     Social History   Socioeconomic History  . Marital status: Married    Spouse name: Not on file  . Number of children: Not on file  . Years of education: Not on  file  . Highest education level: Not on file  Occupational History  . Not on file  Tobacco Use  . Smoking status: Never Smoker  . Smokeless tobacco: Never Used  Vaping Use  . Vaping Use: Never used  Substance and Sexual Activity  . Alcohol use: Yes  . Drug use: No  . Sexual activity: Not Currently  Other Topics Concern  . Not on file  Social History Narrative  . Not on file   Social Determinants of Health   Financial Resource Strain: Not on file  Food Insecurity: Not on file  Transportation Needs: Not on file  Physical Activity: Not on file  Stress: Not on file  Social Connections: Not on file  Intimate Partner Violence: Not on file    ROS Review of Systems  Constitutional: Negative for activity change, appetite change, fatigue, fever and unexpected weight change.  HENT: Negative for ear discharge, ear pain and tinnitus.        Has noted some ear itching, bilaterally.   Eyes: Negative for redness and itching.  Respiratory: Negative for chest tightness and shortness of breath.   Cardiovascular: Negative for chest pain, palpitations and leg swelling.  Gastrointestinal: Negative for constipation, diarrhea, nausea and vomiting.  Endocrine: Negative for cold intolerance, heat intolerance, polydipsia and polyuria.  Genitourinary:  Noted some vaginal dryness. Uses A and D ointment when needed to prevent chaffing.   Musculoskeletal: Negative for arthralgias and gait problem.  Allergic/Immunologic: Positive for environmental allergies.  Neurological: Negative for dizziness and headaches.  Hematological: Negative for adenopathy.  Psychiatric/Behavioral: Negative for dysphoric mood and sleep disturbance. The patient is not nervous/anxious.     Objective:   Today's Vitals   03/08/20 1010  BP: 115/76  Pulse: 74  Temp: 98.7 F (37.1 C)  SpO2: 99%  Weight: 199 lb 8 oz (90.5 kg)  Height: 5' 2.6" (1.59 m)   Body mass index is 35.8 kg/m.    Physical Exam Vitals and  nursing note reviewed.  Constitutional:      Appearance: Normal appearance.  HENT:     Head: Normocephalic and atraumatic.     Ears:     Comments: Some ear wax present in both ear canals, left worse than right. Ear lavage performed on left ear. Good clearance of cerumen. No redness or irritation present in canal after procedure. Patient tolerate this well.     Nose: Nose normal.     Mouth/Throat:     Lips: Pink.     Mouth: Mucous membranes are moist.     Pharynx: Oropharynx is clear. Uvula midline.  Eyes:     Extraocular Movements: Extraocular movements intact.     Pupils: Pupils are equal, round, and reactive to light.  Neck:     Vascular: No carotid bruit.  Cardiovascular:     Rate and Rhythm: Normal rate and regular rhythm.     Pulses: Normal pulses.     Heart sounds: Normal heart sounds.  Pulmonary:     Effort: Pulmonary effort is normal.     Breath sounds: Normal breath sounds.  Abdominal:     Palpations: Abdomen is soft.  Musculoskeletal:        General: Normal range of motion.     Cervical back: Normal range of motion and neck supple.  Lymphadenopathy:     Cervical: No cervical adenopathy.  Skin:    General: Skin is warm and dry.     Capillary Refill: Capillary refill takes less than 2 seconds.  Neurological:     General: No focal deficit present.     Mental Status: She is alert.  Psychiatric:        Mood and Affect: Mood normal.        Behavior: Behavior normal.        Thought Content: Thought content normal.        Judgment: Judgment normal.     Assessment & Plan:  1. Encounter to establish care New patient appointment. Reviewed labs/medical record from previous provider.   2. Excessive cerumen in left ear canal Ear lavage performed on left ear. Good clearance of cerumen without evidence of irritation. Patient tolerated procedure well. Patient to continue with home use of mineral oil in the ear canals every other day. Will use debrox and gentle lavage  approximately once monthly.  - Ear Lavage  3. Mixed hyperlipidemia Reviewed recent labs drawn 11/18/2019. Will get fasting labs with lipid panel prior to next visit and review. Adjust lipitor dosing as indicated.   4. Seasonal allergies Continue to take zyrtec 5mg  daily. Avoid triggers if possible.    Problem List Items Addressed This Visit      Nervous and Auditory   Excessive cerumen in left ear canal   Relevant Orders   Ear Lavage     Other  Seasonal allergies   Hyperlipidemia   Encounter to establish care - Primary      Outpatient Encounter Medications as of 03/08/2020  Medication Sig  . atorvastatin (LIPITOR) 10 MG tablet Take 1 tablet (10 mg total) by mouth daily.  . cetirizine (ZYRTEC) 10 MG tablet Take 10 mg by mouth daily.  . Cholecalciferol (VITAMIN D3) 2000 units TABS Take 2,000 Units daily by mouth.  . Calcium Carbonate Antacid (TUMS PO) Take by mouth as needed. (Patient not taking: Reported on 03/08/2020)  . magnesium 30 MG tablet Take 30 mg daily by mouth. (Patient not taking: Reported on 03/08/2020)  . Multiple Vitamin (MULTIVITAMIN) capsule Take 1 capsule by mouth daily. (Patient not taking: Reported on 03/08/2020)  . omega-3 acid ethyl esters (LOVAZA) 1 g capsule Take 1 g daily by mouth. (Patient not taking: Reported on 03/08/2020)  . pseudoephedrine (SUDAFED) 30 MG tablet Take 30 mg every 4 (four) hours as needed by mouth for congestion. (Patient not taking: Reported on 03/08/2020)   No facility-administered encounter medications on file as of 03/08/2020.    Time spent with patient included reviewing progress notes, labs, and discussing plan for follow up was approximately 45 minutes.   Follow-up: Return in about 6 months (around 09/05/2020) for CPE with FBW few days before.    Ronnell Freshwater, NP

## 2020-08-27 ENCOUNTER — Other Ambulatory Visit: Payer: Self-pay | Admitting: Nurse Practitioner

## 2020-08-27 DIAGNOSIS — Z0000 Encounter for general adult medical examination without abnormal findings: Secondary | ICD-10-CM

## 2020-08-27 DIAGNOSIS — E782 Mixed hyperlipidemia: Secondary | ICD-10-CM

## 2020-09-02 ENCOUNTER — Telehealth: Payer: Self-pay | Admitting: Nurse Practitioner

## 2020-09-02 ENCOUNTER — Other Ambulatory Visit: Payer: Self-pay | Admitting: Nurse Practitioner

## 2020-09-02 DIAGNOSIS — E782 Mixed hyperlipidemia: Secondary | ICD-10-CM

## 2020-09-02 MED ORDER — ATORVASTATIN CALCIUM 10 MG PO TABS: 10.0000 mg | ORAL_TABLET | Freq: Every day | ORAL | 3 refills | Status: DC

## 2020-09-02 NOTE — Telephone Encounter (Signed)
Patient would like a prescription for her cholesterol medication. Please advise. (Atorvastatin)

## 2020-09-02 NOTE — Telephone Encounter (Signed)
Please let the patient know that I Sent new prescription for patient's atorvastatin to [place].  Thanks  -HB

## 2020-09-02 NOTE — Progress Notes (Signed)
Sent new prescription for patient's atorvastatin to [place].

## 2020-09-02 NOTE — Telephone Encounter (Signed)
Called pt she is aware of her Rx that was sent to the pharmacy

## 2020-09-03 ENCOUNTER — Other Ambulatory Visit: Payer: Self-pay

## 2020-09-06 ENCOUNTER — Encounter: Payer: Self-pay | Admitting: Nurse Practitioner

## 2020-09-10 ENCOUNTER — Other Ambulatory Visit: Payer: Self-pay

## 2020-09-10 ENCOUNTER — Other Ambulatory Visit: Payer: 59

## 2020-09-13 ENCOUNTER — Encounter: Payer: Self-pay | Admitting: Nurse Practitioner

## 2020-09-13 ENCOUNTER — Other Ambulatory Visit: Payer: Self-pay

## 2020-09-13 ENCOUNTER — Ambulatory Visit (INDEPENDENT_AMBULATORY_CARE_PROVIDER_SITE_OTHER): Payer: 59 | Admitting: Nurse Practitioner

## 2020-09-13 VITALS — BP 107/73 | HR 68 | Temp 97.3°F | Ht 62.6 in | Wt 197.9 lb

## 2020-09-13 DIAGNOSIS — Z1231 Encounter for screening mammogram for malignant neoplasm of breast: Secondary | ICD-10-CM

## 2020-09-13 DIAGNOSIS — E782 Mixed hyperlipidemia: Secondary | ICD-10-CM | POA: Diagnosis not present

## 2020-09-13 DIAGNOSIS — Z6835 Body mass index (BMI) 35.0-35.9, adult: Secondary | ICD-10-CM

## 2020-09-13 DIAGNOSIS — Z0001 Encounter for general adult medical examination with abnormal findings: Secondary | ICD-10-CM

## 2020-09-13 DIAGNOSIS — Z1211 Encounter for screening for malignant neoplasm of colon: Secondary | ICD-10-CM

## 2020-09-13 DIAGNOSIS — Z0000 Encounter for general adult medical examination without abnormal findings: Secondary | ICD-10-CM

## 2020-09-13 NOTE — Progress Notes (Signed)
Established Patient Office Visit  Subjective:  Patient ID: Veronica Leonard, female    DOB: [birth year]  Age: 65 y.o. MRN: [id number]  CC:  Chief Complaint  Patient presents with   Annual Exam    HPI [name] presents for annual health maintenance exam.  She denies current concerns or complaints today.  She denies chest pain, chest pressure, or shortness of breath. He denies headaches or visual disturbances. He denies abdominal pain, nausea, vomiting, or changes in bowel or bladder habits.  She does have history of high cholesterol.  She takes atorvastatin every day.  She is due to have check of routine, fasting labs.  She should be scheduled for screening mammogram after 01/03/2031.  She is also due to have screening colonoscopy.  Past Medical History:  Diagnosis Date   Anxiety    SITUATIONAL   Hyperlipidemia    Seasonal allergies     History reviewed. No pertinent surgical history.  Family History  Problem Relation Age of Onset   Diabetes Father    Hypertension Sister    Heart disease Sister    Heart murmur Sister    Diabetes Brother    Heart murmur Brother     Social History   Socioeconomic History   Marital status: Married    Spouse name: Not on file   Number of children: Not on file   Years of education: Not on file   Highest education level: Not on file  Occupational History   Not on file  Tobacco Use   Smoking status: Never   Smokeless tobacco: Never  Vaping Use   Vaping Use: Never used  Substance and Sexual Activity   Alcohol use: Yes   Drug use: No   Sexual activity: Not Currently  Other Topics Concern   Not on file  Social History Narrative   Not on file   Social Determinants of Health   Financial Resource Strain: Not on file  Food Insecurity: Not on file  Transportation Needs: Not on file  Physical Activity: Not on file  Stress: Not on file  Social Connections: Not on file  Intimate Partner Violence: Not on file    Outpatient  Medications Prior to Visit  Medication Sig Dispense Refill   atorvastatin (LIPITOR) 10 MG tablet Take 1 tablet (10 mg total) by mouth daily. 90 tablet 3   cetirizine (ZYRTEC) 10 MG tablet Take 10 mg by mouth daily.     Cholecalciferol (VITAMIN D3) 2000 units TABS Take 2,000 Units daily by mouth.     No facility-administered medications prior to visit.    Allergies  Allergen Reactions   Other     ROS Review of Systems  Constitutional:  Negative for activity change, appetite change, chills, fatigue and fever.  HENT:  Negative for congestion, postnasal drip, rhinorrhea, sinus pressure, sinus pain, sneezing and sore throat.   Eyes: Negative.   Respiratory:  Negative for cough, chest tightness, shortness of breath and wheezing.   Cardiovascular:  Negative for chest pain and palpitations.  Gastrointestinal:  Negative for abdominal pain, constipation, diarrhea, nausea and vomiting.  Endocrine: Negative for cold intolerance, heat intolerance, polydipsia and polyuria.  Genitourinary:  Negative for dyspareunia, dysuria, flank pain, frequency and urgency.  Musculoskeletal:  Negative for arthralgias, back pain and myalgias.  Skin:  Negative for rash.  Allergic/Immunologic: Negative for environmental allergies.  Neurological:  Negative for dizziness, weakness and headaches.  Hematological:  Negative for adenopathy.  Psychiatric/Behavioral:  The patient is not nervous/anxious.  Objective:    Physical Exam Vitals and nursing note reviewed.  Constitutional:      Appearance: Normal appearance. She is well-developed. She is obese.  HENT:     Head: Normocephalic and atraumatic.     Right Ear: Tympanic membrane, ear canal and external ear normal.     Left Ear: Tympanic membrane, ear canal and external ear normal.     Nose: Nose normal.     Mouth/Throat:     Mouth: Mucous membranes are moist.     Pharynx: Oropharynx is clear.  Eyes:     Extraocular Movements: Extraocular movements  intact.     Conjunctiva/sclera: Conjunctivae normal.     Pupils: Pupils are equal, round, and reactive to light.  Neck:     Vascular: No carotid bruit.  Cardiovascular:     Rate and Rhythm: Normal rate and regular rhythm.     Pulses: Normal pulses.     Heart sounds: Normal heart sounds.  Pulmonary:     Effort: Pulmonary effort is normal.     Breath sounds: Normal breath sounds.  Abdominal:     General: Bowel sounds are normal.     Palpations: Abdomen is soft. There is no mass.     Tenderness: There is no abdominal tenderness.     Hernia: No hernia is present.  Musculoskeletal:        General: Normal range of motion.     Cervical back: Normal range of motion and neck supple.  Lymphadenopathy:     Cervical: No cervical adenopathy.  Skin:    General: Skin is warm and dry.     Capillary Refill: Capillary refill takes less than 2 seconds.  Neurological:     General: No focal deficit present.     Mental Status: She is alert and oriented to person, place, and time.  Psychiatric:        Mood and Affect: Mood normal.        Behavior: Behavior normal.        Thought Content: Thought content normal.        Judgment: Judgment normal.    Today's Vitals   09/13/20 0851  BP: 107/73  Pulse: 68  Temp: (!) 97.3 F (36.3 C)  SpO2: 98%  Weight: 197 lb 14.4 oz (89.8 kg)  Height: 5' 2.6" (1.59 m)   Body mass index is 35.51 kg/m.   Wt Readings from Last 3 Encounters:  09/13/20 197 lb 14.4 oz (89.8 kg)  03/08/20 199 lb 8 oz (90.5 kg)  06/18/19 194 lb (88 kg)     Health Maintenance Due  Topic Date Due   Zoster Vaccines- Shingrix (1 of 2) Never done   COLONOSCOPY (Pts 45-11yr Insurance coverage will need to be confirmed)  09/17/2017   COVID-19 Vaccine (4 - Booster for Pfizer series) 02/24/2020   PAP SMEAR-Modifier  06/20/2020   INFLUENZA VACCINE  08/16/2020    There are no preventive care reminders to display for this patient.  Lab Results  Component Value Date   TSH 2.410  09/13/2020   Lab Results  Component Value Date   WBC 8.2 09/13/2020   HGB 14.8 09/13/2020   HCT 45.7 09/13/2020   MCV 89 09/13/2020   PLT 274 09/13/2020   Lab Results  Component Value Date   NA 139 09/13/2020   K 4.3 09/13/2020   CO2 26 09/13/2020   GLUCOSE 96 09/13/2020   BUN 12 09/13/2020   CREATININE 0.77 09/13/2020   BILITOT 0.5 09/13/2020  ALKPHOS 82 09/13/2020   AST 24 09/13/2020   ALT 30 09/13/2020   PROT 7.1 09/13/2020   ALBUMIN 4.7 09/13/2020   CALCIUM 9.7 09/13/2020   EGFR 87 09/13/2020   Lab Results  Component Value Date   CHOL 261 (H) 09/13/2020   Lab Results  Component Value Date   HDL 47 09/13/2020   Lab Results  Component Value Date   LDLCALC 163 (H) 09/13/2020   Lab Results  Component Value Date   TRIG 275 (H) 09/13/2020   Lab Results  Component Value Date   CHOLHDL 5.6 (H) 09/13/2020   Lab Results  Component Value Date   HGBA1C 6.1 (H) 09/13/2020      Assessment & Plan:  1. Encounter for general adult medical examination with abnormal findings Annual health maintenance exam today.  2. Mixed hyperlipidemia Check routine fasting labs.  Adjust dosing of atorvastatin as indicated. - Hemoglobin A1c - Lipid panel - TSH - Comprehensive metabolic panel - CBC  3. Screening for colon cancer Refer to GI for screening colonoscopy. - Ambulatory referral to Gastroenterology  4. Body mass index (BMI) of 35.0-35.9 in adult Recommend she limit her daily calorie intake to 1500 cal/day.  She should consume a low fat, low-cholesterol diet and incorporate exercise into her daily routine.  5. Encounter for screening mammogram for malignant neoplasm of breast Screening mammogram ordered today. - MM DIGITAL SCREENING BILATERAL; Future  6. Healthcare maintenance Routine, fasting labs drawn during today's visit. - Hemoglobin A1c - Lipid panel - TSH - Comprehensive metabolic panel - CBC   Problem List Items Addressed This Visit       Other    Hyperlipidemia   Screening for colon cancer   Relevant Orders   Ambulatory referral to Gastroenterology   Encounter for screening mammogram for malignant neoplasm of breast   Relevant Orders   MM DIGITAL SCREENING BILATERAL   Encounter for general adult medical examination with abnormal findings - Primary   Body mass index (BMI) of 35.0-35.9 in adult   Other Visit Diagnoses     Healthcare maintenance          This note was dictated using Dragon Voice Recognition Software. Rapid proofreading was performed to expedite the delivery of the information. Despite proofreading, phonetic errors will occur which are common with this voice recognition software. Please take this into consideration. If there are any concerns, please contact our office.     Follow-up: Return in about 1 year (around 09/13/2021) for health maintenance exam, with pap, FBW a week prior to visit.    Ronnell Freshwater, NP

## 2020-09-14 LAB — COMPREHENSIVE METABOLIC PANEL
ALT: 30 IU/L (ref 0–32)
AST: 24 IU/L (ref 0–40)
Albumin/Globulin Ratio: 2 (ref 1.2–2.2)
Albumin: 4.7 g/dL (ref 3.8–4.8)
Alkaline Phosphatase: 82 IU/L (ref 44–121)
BUN/Creatinine Ratio: 16 (ref 12–28)
BUN: 12 mg/dL (ref 8–27)
Bilirubin Total: 0.5 mg/dL (ref 0.0–1.2)
CO2: 26 mmol/L (ref 20–29)
Calcium: 9.7 mg/dL (ref 8.7–10.3)
Chloride: 98 mmol/L (ref 96–106)
Creatinine, Ser: 0.77 mg/dL (ref 0.57–1.00)
Globulin, Total: 2.4 g/dL (ref 1.5–4.5)
Glucose: 96 mg/dL (ref 65–99)
Potassium: 4.3 mmol/L (ref 3.5–5.2)
Sodium: 139 mmol/L (ref 134–144)
Total Protein: 7.1 g/dL (ref 6.0–8.5)
eGFR: 87 mL/min/{1.73_m2} (ref >59)

## 2020-09-14 LAB — HEMOGLOBIN A1C
Est. average glucose Bld gHb Est-mCnc: 128 mg/dL
Hgb A1c MFr Bld: 6.1 % — ABNORMAL HIGH (ref 4.8–5.6)

## 2020-09-14 LAB — CBC
Hematocrit: 45.7 % (ref 34.0–46.6)
Hemoglobin: 14.8 g/dL (ref 11.1–15.9)
MCH: 28.9 pg (ref 26.6–33.0)
MCHC: 32.4 g/dL (ref 31.5–35.7)
MCV: 89 fL (ref 79–97)
Platelets: 274 10*3/uL (ref 150–450)
RBC: 5.12 x10E6/uL (ref 3.77–5.28)
RDW: 12.5 % (ref 11.7–15.4)
WBC: 8.2 10*3/uL (ref 3.4–10.8)

## 2020-09-14 LAB — LIPID PANEL
Chol/HDL Ratio: 5.6 ratio — ABNORMAL HIGH (ref 0.0–4.4)
Cholesterol, Total: 261 mg/dL — ABNORMAL HIGH (ref 100–199)
HDL: 47 mg/dL (ref >39)
LDL Chol Calc (NIH): 163 mg/dL — ABNORMAL HIGH (ref 0–99)
Triglycerides: 275 mg/dL — ABNORMAL HIGH (ref 0–149)
VLDL Cholesterol Cal: 51 mg/dL — ABNORMAL HIGH (ref 5–40)

## 2020-09-14 LAB — TSH: TSH: 2.41 u[IU]/mL (ref 0.450–4.500)

## 2020-09-20 DIAGNOSIS — Z6836 Body mass index (BMI) 36.0-36.9, adult: Secondary | ICD-10-CM | POA: Insufficient documentation

## 2020-09-20 DIAGNOSIS — Z0000 Encounter for general adult medical examination without abnormal findings: Secondary | ICD-10-CM | POA: Insufficient documentation

## 2020-09-20 DIAGNOSIS — Z6835 Body mass index (BMI) 35.0-35.9, adult: Secondary | ICD-10-CM | POA: Insufficient documentation

## 2020-09-20 DIAGNOSIS — Z0001 Encounter for general adult medical examination with abnormal findings: Secondary | ICD-10-CM | POA: Insufficient documentation

## 2020-09-20 DIAGNOSIS — Z1231 Encounter for screening mammogram for malignant neoplasm of breast: Secondary | ICD-10-CM | POA: Insufficient documentation

## 2020-09-20 NOTE — Patient Instructions (Signed)

## 2020-12-01 ENCOUNTER — Ambulatory Visit
Admission: RE | Admit: 2020-12-01 | Discharge: 2020-12-01 | Disposition: A | Discharge status: Home / Self Care | Payer: 59 | Source: Ambulatory Visit | Attending: Nurse Practitioner | Admitting: Nurse Practitioner

## 2020-12-01 ENCOUNTER — Other Ambulatory Visit: Payer: Self-pay

## 2020-12-01 DIAGNOSIS — Z1231 Encounter for screening mammogram for malignant neoplasm of breast: Secondary | ICD-10-CM

## 2020-12-01 NOTE — Progress Notes (Signed)
Negative mammogram

## 2021-09-13 NOTE — Progress Notes (Unsigned)
Complete physical exam   Patient: Veronica Leonard   DOB: 02/19/1955   66 y.o. Female  MRN: [id number] Visit Date: 09/14/2021    No chief complaint on file.  Subjective    Veronica Leonard is a 66 y.o. female who presents today for a complete physical exam.  She reports consuming a {diet types:17450} diet. {Exercise:19826} She generally feels {well/fairly well/poorly:18703}. She {does/does not:200015} have additional problems to discuss today.   HPI  Annual physical with pap smear -due to have routine, fasting labs at today's visit  -most recent mammogram done 12/01/2020 - negative results  -due to have screening colonoscopy   Past Medical History:  Diagnosis Date   Anxiety    SITUATIONAL   Hyperlipidemia    Seasonal allergies    No past surgical history on file. Social History   Socioeconomic History   Marital status: Married    Spouse name: Not on file   Number of children: Not on file   Years of education: Not on file   Highest education level: Not on file  Occupational History   Not on file  Tobacco Use   Smoking status: Never   Smokeless tobacco: Never  Vaping Use   Vaping Use: Never used  Substance and Sexual Activity   Alcohol use: Yes   Drug use: No   Sexual activity: Not Currently  Other Topics Concern   Not on file  Social History Narrative   Not on file   Social Determinants of Health   Financial Resource Strain: Not on file  Food Insecurity: Not on file  Transportation Needs: Not on file  Physical Activity: Not on file  Stress: Not on file  Social Connections: Not on file  Intimate Partner Violence: Not on file   Family Status  Relation Name Status   Father  Deceased   Sister  Alive   Brother  Alive   Neg Hx  (Not Specified)   Family History  Problem Relation Age of Onset   Diabetes Father    Hypertension Sister    Heart disease Sister    Heart murmur Sister    Diabetes Brother    Heart murmur Brother    Breast cancer Neg Hx     Allergies  Allergen Reactions   Other     Patient Care Team: Ronnell Freshwater, NP as PCP - General (Family Medicine) Huel Cote, NP (Inactive) as Nurse Practitioner (Obstetrics and Gynecology)   Medications: Outpatient Medications Prior to Visit  Medication Sig   atorvastatin (LIPITOR) 10 MG tablet Take 1 tablet (10 mg total) by mouth daily.   cetirizine (ZYRTEC) 10 MG tablet Take 10 mg by mouth daily.   Cholecalciferol (VITAMIN D3) 2000 units TABS Take 2,000 Units daily by mouth.   No facility-administered medications prior to visit.    Review of Systems  {Labs (Optional):23779}   Objective    LMP 09/14/2012  BP Readings from Last 3 Encounters:  09/13/20 107/73  03/08/20 115/76  06/18/19 136/80    Wt Readings from Last 3 Encounters:  09/13/20 197 lb 14.4 oz (89.8 kg)  03/08/20 199 lb 8 oz (90.5 kg)  06/18/19 194 lb (88 kg)     Physical Exam  ***  Last depression screening scores    09/13/2020    8:52 AM 03/08/2020   10:14 AM 11/29/2017    8:18 AM  PHQ 2/9 Scores  PHQ - 2 Score 0 0 0  PHQ- 9 Score 0 2 0  Last fall risk screening    09/13/2020    8:52 AM  Fall Risk   Falls in the past year? 0  Number falls in past yr: 0  Injury with Fall? 0  Follow up Falls evaluation completed   Last Audit-C alcohol use screening     No data to display         A score of 3 or more in women, and 4 or more in men indicates increased risk for alcohol abuse, EXCEPT if all of the points are from question 1   No results found for any visits on 09/14/21.  Assessment & Plan    Routine Health Maintenance and Physical Exam  Exercise Activities and Dietary recommendations  Goals   None     Immunization History  Administered Date(s) Administered   Influenza,inj,Quad PF,6+ Mos 11/14/2012, 11/29/2016   PFIZER(Purple Top)SARS-COV-2 Vaccination 03/22/2019, 04/12/2019, 10/24/2019   Tdap 03/10/2016    Health Maintenance  Topic Date Due   Zoster Vaccines-  Shingrix (1 of 2) Never done   COLONOSCOPY (Pts 45-53yr Insurance coverage will need to be confirmed)  09/17/2017   COVID-19 Vaccine (4 - Pfizer series) 12/19/2019   PAP SMEAR-Modifier  06/20/2020   INFLUENZA VACCINE  08/16/2021   MAMMOGRAM  12/02/2022   TETANUS/TDAP  03/10/2026   Hepatitis C Screening  Completed   HIV Screening  Completed   HPV VACCINES  Aged Out    Discussed health benefits of physical activity, and encouraged her to engage in regular exercise appropriate for her age and condition.  Problem List Items Addressed This Visit   None    No follow-ups on file.        HRonnell Freshwater NP  [phone](phone) [phone](fax)  [place]

## 2021-09-14 ENCOUNTER — Encounter: Payer: Self-pay | Admitting: Nurse Practitioner

## 2021-09-14 ENCOUNTER — Ambulatory Visit (INDEPENDENT_AMBULATORY_CARE_PROVIDER_SITE_OTHER): Payer: 59 | Admitting: Nurse Practitioner

## 2021-09-14 VITALS — BP 108/71 | HR 64 | Ht 62.0 in | Wt 201.4 lb

## 2021-09-14 DIAGNOSIS — Z0001 Encounter for general adult medical examination with abnormal findings: Secondary | ICD-10-CM

## 2021-09-14 DIAGNOSIS — Z0000 Encounter for general adult medical examination without abnormal findings: Secondary | ICD-10-CM | POA: Diagnosis not present

## 2021-09-14 DIAGNOSIS — E559 Vitamin D deficiency, unspecified: Secondary | ICD-10-CM | POA: Insufficient documentation

## 2021-09-14 DIAGNOSIS — Z1231 Encounter for screening mammogram for malignant neoplasm of breast: Secondary | ICD-10-CM

## 2021-09-14 DIAGNOSIS — R7301 Impaired fasting glucose: Secondary | ICD-10-CM | POA: Diagnosis not present

## 2021-09-14 DIAGNOSIS — Z1211 Encounter for screening for malignant neoplasm of colon: Secondary | ICD-10-CM | POA: Diagnosis not present

## 2021-09-14 DIAGNOSIS — Z6836 Body mass index (BMI) 36.0-36.9, adult: Secondary | ICD-10-CM

## 2021-09-14 DIAGNOSIS — E782 Mixed hyperlipidemia: Secondary | ICD-10-CM | POA: Diagnosis not present

## 2021-09-15 LAB — TSH+FREE T4
Free T4: 1.16 ng/dL (ref 0.82–1.77)
TSH: 2.19 u[IU]/mL (ref 0.450–4.500)

## 2021-09-15 LAB — COMPREHENSIVE METABOLIC PANEL
ALT: 20 IU/L (ref 0–32)
AST: 20 IU/L (ref 0–40)
Albumin/Globulin Ratio: 2 (ref 1.2–2.2)
Albumin: 4.6 g/dL (ref 3.9–4.9)
Alkaline Phosphatase: 79 IU/L (ref 44–121)
BUN/Creatinine Ratio: 18 (ref 12–28)
BUN: 13 mg/dL (ref 8–27)
Bilirubin Total: 0.5 mg/dL (ref 0.0–1.2)
CO2: 24 mmol/L (ref 20–29)
Calcium: 9.7 mg/dL (ref 8.7–10.3)
Chloride: 99 mmol/L (ref 96–106)
Creatinine, Ser: 0.73 mg/dL (ref 0.57–1.00)
Globulin, Total: 2.3 g/dL (ref 1.5–4.5)
Glucose: 100 mg/dL — ABNORMAL HIGH (ref 70–99)
Potassium: 4.3 mmol/L (ref 3.5–5.2)
Sodium: 138 mmol/L (ref 134–144)
Total Protein: 6.9 g/dL (ref 6.0–8.5)
eGFR: 92 mL/min/{1.73_m2} (ref >59)

## 2021-09-15 LAB — CBC
Hematocrit: 42.6 % (ref 34.0–46.6)
Hemoglobin: 14 g/dL (ref 11.1–15.9)
MCH: 29.8 pg (ref 26.6–33.0)
MCHC: 32.9 g/dL (ref 31.5–35.7)
MCV: 91 fL (ref 79–97)
Platelets: 253 10*3/uL (ref 150–450)
RBC: 4.7 x10E6/uL (ref 3.77–5.28)
RDW: 12.6 % (ref 11.7–15.4)
WBC: 7.3 10*3/uL (ref 3.4–10.8)

## 2021-09-15 LAB — LIPID PANEL
Chol/HDL Ratio: 4.8 ratio — ABNORMAL HIGH (ref 0.0–4.4)
Cholesterol, Total: 223 mg/dL — ABNORMAL HIGH (ref 100–199)
HDL: 46 mg/dL (ref >39)
LDL Chol Calc (NIH): 142 mg/dL — ABNORMAL HIGH (ref 0–99)
Triglycerides: 197 mg/dL — ABNORMAL HIGH (ref 0–149)
VLDL Cholesterol Cal: 35 mg/dL (ref 5–40)

## 2021-09-15 LAB — HEMOGLOBIN A1C
Est. average glucose Bld gHb Est-mCnc: 126 mg/dL
Hgb A1c MFr Bld: 6 % — ABNORMAL HIGH (ref 4.8–5.6)

## 2021-09-15 LAB — VITAMIN D 25 HYDROXY (VIT D DEFICIENCY, FRACTURES): Vit D, 25-Hydroxy: 44.5 ng/mL (ref 30.0–100.0)

## 2021-09-21 NOTE — Progress Notes (Signed)
Please let the patient know that labs are back. Her cholesterol and blood sugars are just a little high, but improved from checks last year. I recommend she  limit intake of fried and fatty foods. She should increase intake of lean proteins and green leafy vegetables. Adding exercise into daily routine will also be beneficial.  Her remaining labs were good.  Thanks so much.   -HB

## 2021-09-28 ENCOUNTER — Other Ambulatory Visit: Payer: Self-pay

## 2021-09-28 ENCOUNTER — Telehealth: Payer: Self-pay | Admitting: Nurse Practitioner

## 2021-09-28 DIAGNOSIS — E782 Mixed hyperlipidemia: Secondary | ICD-10-CM

## 2021-09-28 MED ORDER — ATORVASTATIN CALCIUM 10 MG PO TABS: 10.0000 mg | ORAL_TABLET | Freq: Every day | ORAL | 3 refills | Status: DC

## 2021-09-28 NOTE — Telephone Encounter (Signed)
Rx was sent to pharmacy. 

## 2021-09-28 NOTE — Telephone Encounter (Signed)
Patient requesting refill of cholesterol medication. Please advise.

## 2021-12-07 ENCOUNTER — Ambulatory Visit
Admission: RE | Admit: 2021-12-07 | Discharge: 2021-12-07 | Disposition: A | Discharge status: Home / Self Care | Payer: 59 | Source: Ambulatory Visit | Attending: Nurse Practitioner | Admitting: Nurse Practitioner

## 2021-12-07 DIAGNOSIS — Z1231 Encounter for screening mammogram for malignant neoplasm of breast: Secondary | ICD-10-CM

## 2021-12-12 NOTE — Progress Notes (Signed)
Negative mammogram. Repeat in one year

## 2022-02-24 IMAGING — MG MM DIGITAL SCREENING BILAT W/ TOMO AND CAD
8 series · 8 of 24 positions shown · non-contrast
Comparison: Previous exam(s).

CLINICAL DATA: Screening.

EXAM:
DIGITAL SCREENING BILATERAL MAMMOGRAM WITH TOMOSYNTHESIS AND CAD
TECHNIQUE: Bilateral screening digital craniocaudal and mediolateral oblique
mammograms were obtained. Bilateral screening digital breast
tomosynthesis was performed. The images were evaluated with
computer-aided detection.

[L MLO synth-2D]
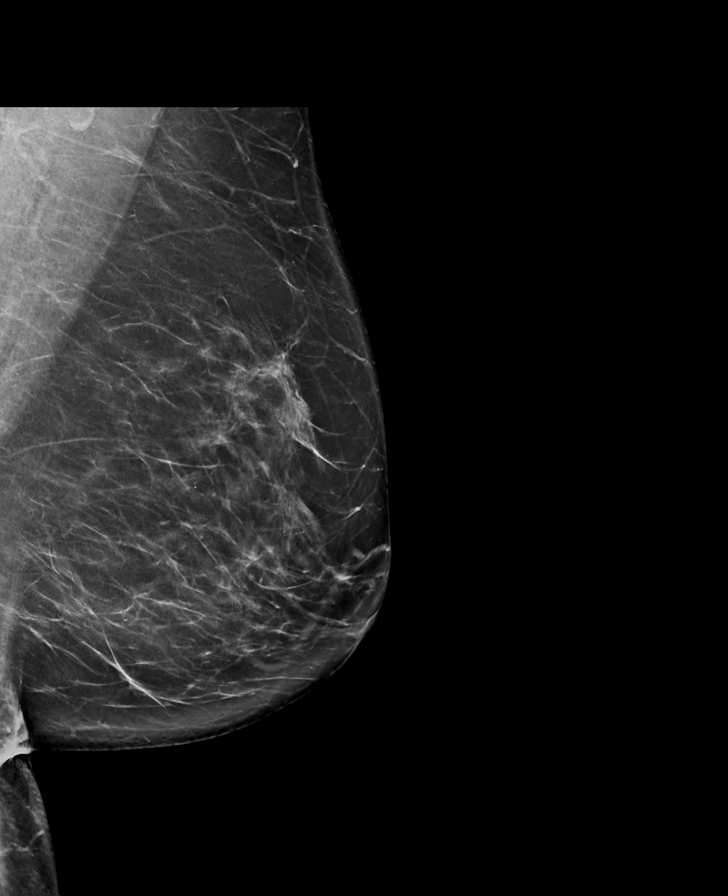

[R CC synth-2D]
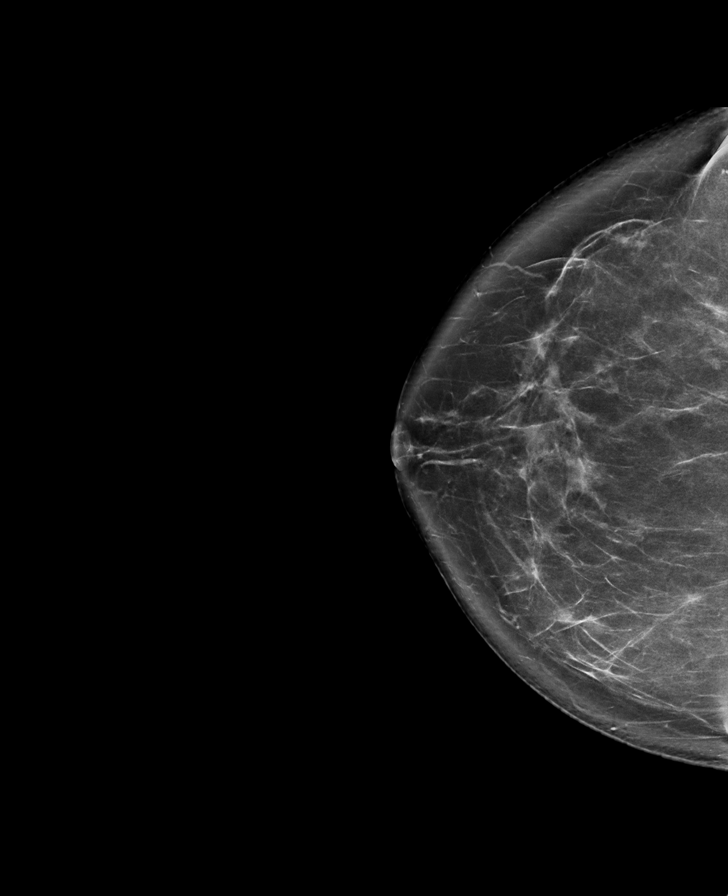

[L CC synth-2D]
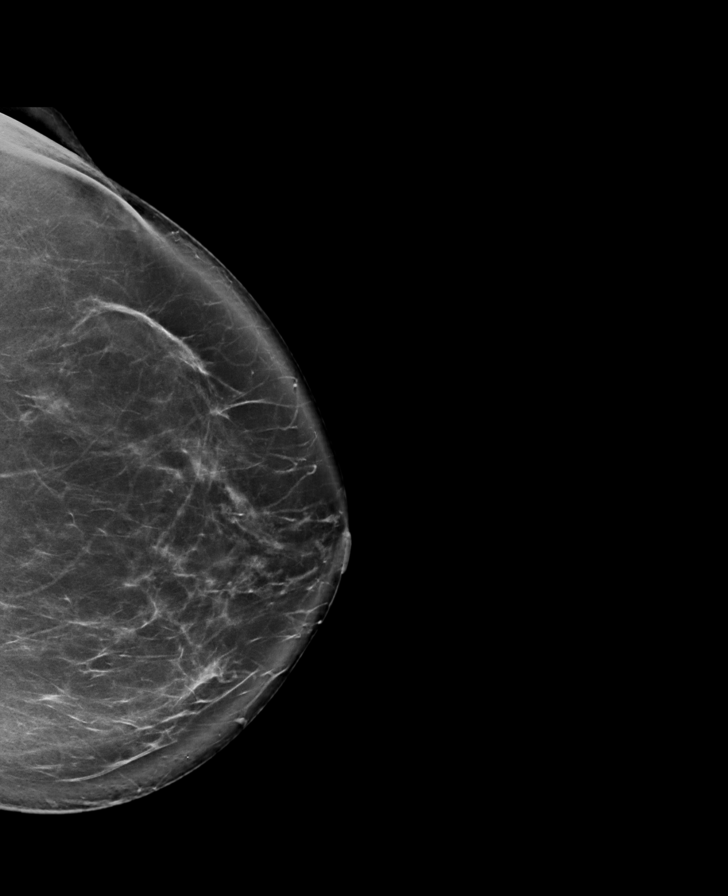

[R MLO synth-2D]
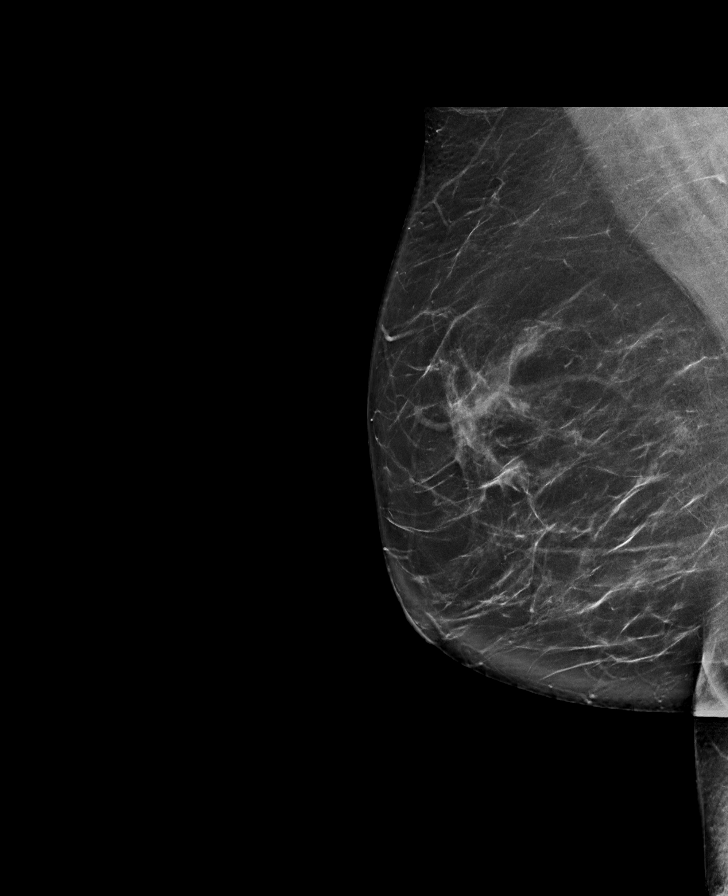

[R MLO tomo · tomo slice 46/91.0]
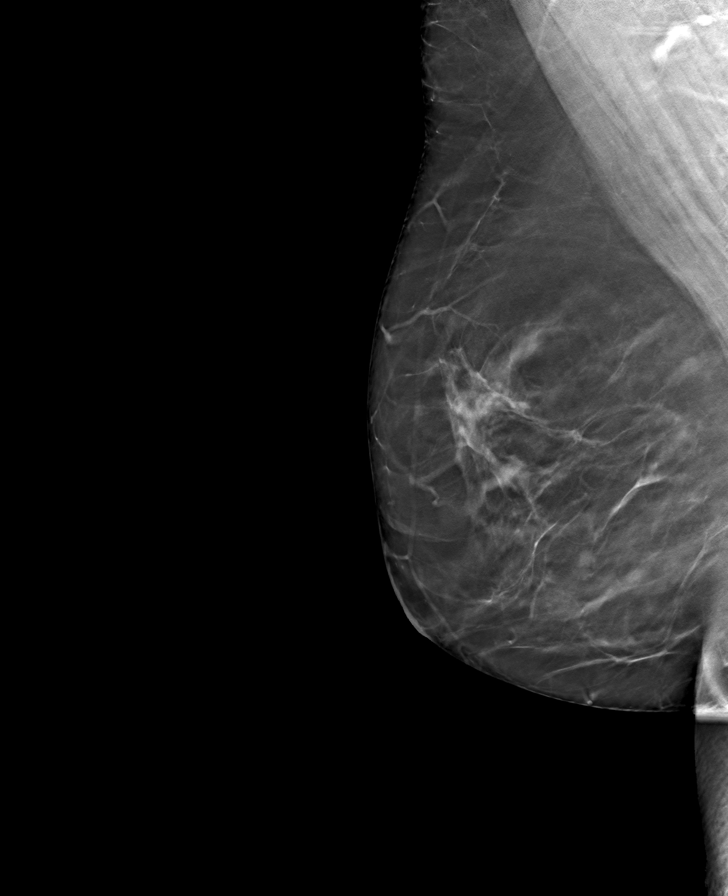

[L CC tomo · tomo slice 47/93.0]
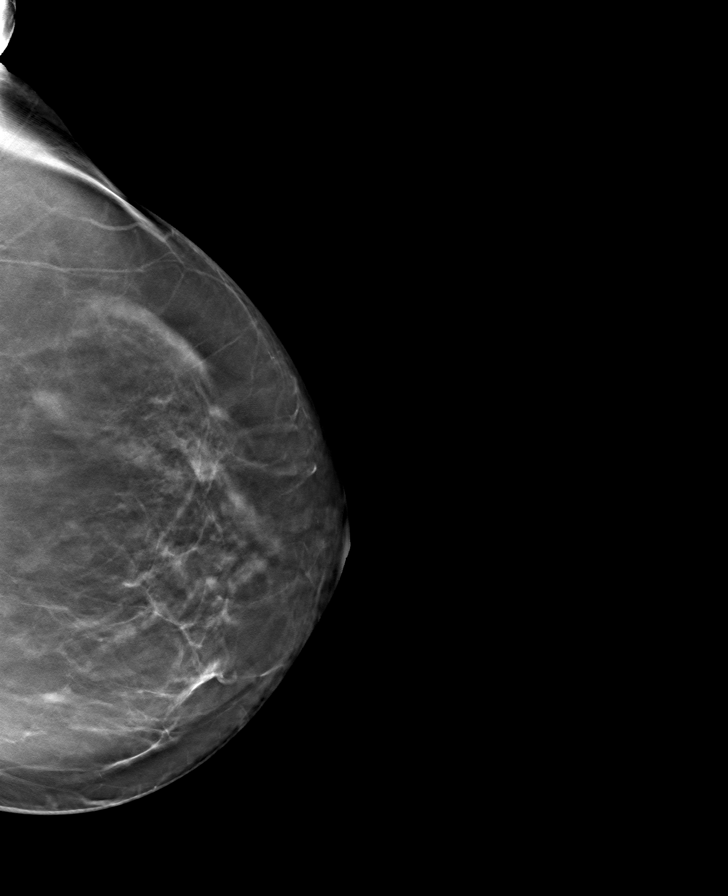

[R CC tomo · tomo slice 46/91.0]
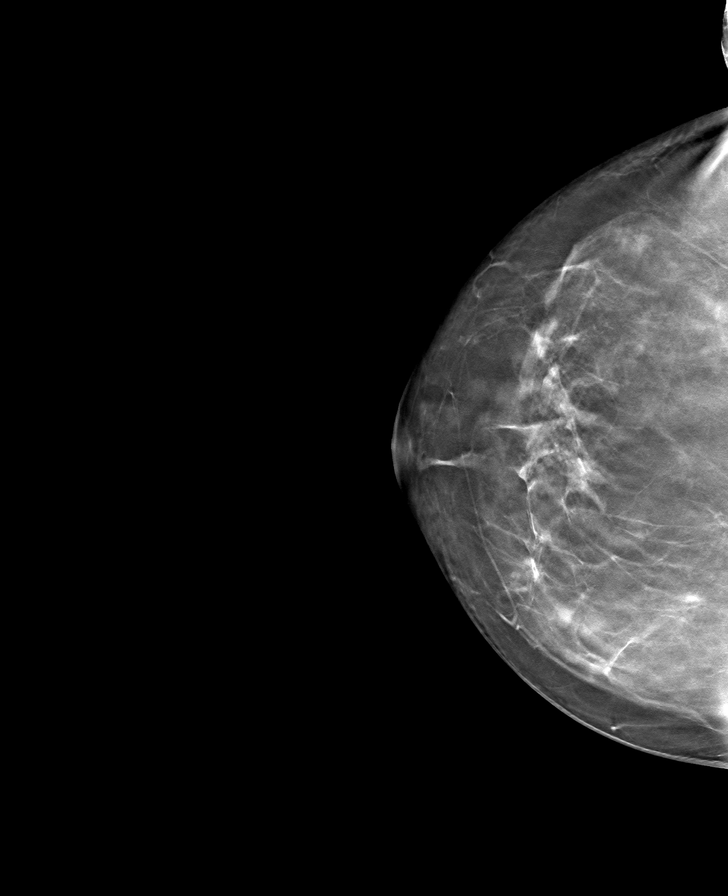

[L MLO tomo · tomo slice 47/92.0]
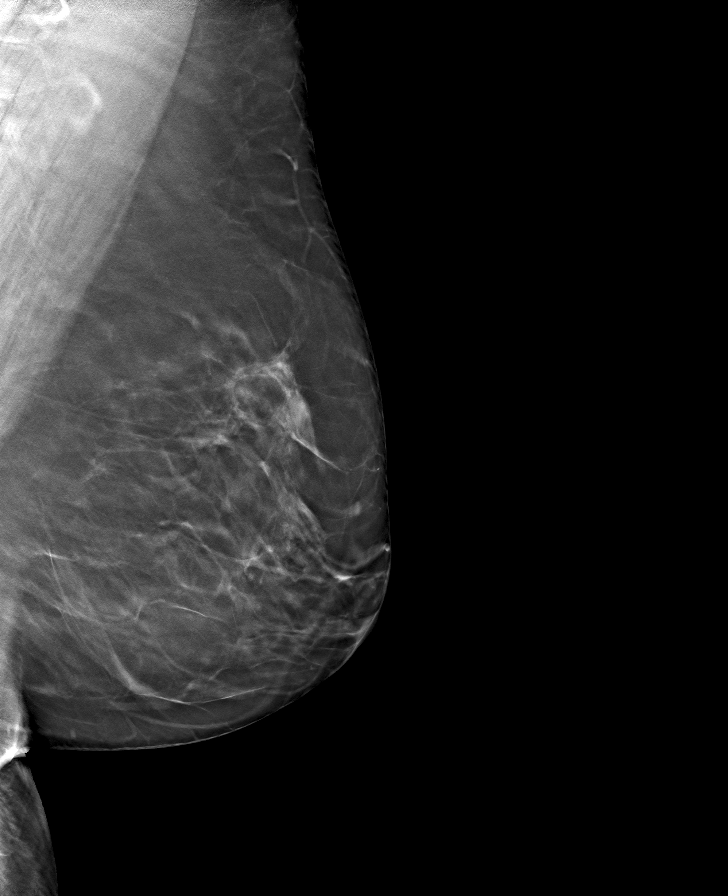

[8 of 24 positions shown; findings below may reference images not displayed]

ACR Breast Density Category b: There are scattered areas of
fibroglandular density.
FINDINGS: There are no findings suspicious for malignancy.
IMPRESSION: No mammographic evidence of malignancy. A result letter of this
screening mammogram will be mailed directly to the patient.

RECOMMENDATION:
Screening mammogram in one year. (Code:51-O-LD2)

BI-RADS CATEGORY  1: Negative.

## 2022-04-25 ENCOUNTER — Encounter: Payer: Self-pay | Admitting: Gastroenterology

## 2022-05-09 ENCOUNTER — Ambulatory Visit (AMBULATORY_SURGERY_CENTER): Payer: 59

## 2022-05-09 VITALS — Ht 63.0 in | Wt 200.0 lb

## 2022-05-09 DIAGNOSIS — Z1211 Encounter for screening for malignant neoplasm of colon: Secondary | ICD-10-CM

## 2022-05-09 MED ORDER — NA SULFATE-K SULFATE-MG SULF 17.5-3.13-1.6 GM/177ML PO SOLN: 1.0000 | Freq: Once | ORAL | 0 refills | Status: AC

## 2022-05-09 NOTE — Progress Notes (Signed)

## 2022-05-24 ENCOUNTER — Encounter: Payer: Self-pay | Admitting: Gastroenterology

## 2022-06-06 ENCOUNTER — Encounter: Payer: Self-pay | Admitting: Gastroenterology

## 2022-06-06 ENCOUNTER — Ambulatory Visit (AMBULATORY_SURGERY_CENTER): Payer: 59 | Admitting: Gastroenterology

## 2022-06-06 VITALS — BP 113/63 | HR 67 | Temp 97.5°F | Resp 18 | Ht 63.0 in | Wt 200.0 lb

## 2022-06-06 DIAGNOSIS — Z1211 Encounter for screening for malignant neoplasm of colon: Secondary | ICD-10-CM

## 2022-06-06 DIAGNOSIS — D122 Benign neoplasm of ascending colon: Secondary | ICD-10-CM | POA: Diagnosis not present

## 2022-06-06 DIAGNOSIS — K635 Polyp of colon: Secondary | ICD-10-CM | POA: Diagnosis not present

## 2022-06-06 DIAGNOSIS — E785 Hyperlipidemia, unspecified: Secondary | ICD-10-CM | POA: Diagnosis not present

## 2022-06-06 MED ORDER — SODIUM CHLORIDE 0.9 % IV SOLN
500.0000 mL | Freq: Once | INTRAVENOUS | Status: DC
Start: 2022-06-06 — End: 2022-06-06

## 2022-06-06 NOTE — Patient Instructions (Addendum)
Resume previous diet Continue present medications Repeat colonoscopy in 3-5 years based on pathology results Await pathology results  Handouts/information given for polyps   YOU HAD AN ENDOSCOPIC PROCEDURE TODAY AT THE [facility]:   Refer to the procedure report that was given to you for any specific questions about what was found during the examination.  If the procedure report does not answer your questions, please call your gastroenterologist to clarify.  If you requested that your care partner not be given the details of your procedure findings, then the procedure report has been included in a sealed envelope for you to review at your convenience later.  YOU SHOULD EXPECT: Some feelings of bloating in the abdomen. Passage of more gas than usual.  Walking can help get rid of the air that was put into your GI tract during the procedure and reduce the bloating. If you had a lower endoscopy (such as a colonoscopy or flexible sigmoidoscopy) you may notice spotting of blood in your stool or on the toilet paper. If you underwent a bowel prep for your procedure, you may not have a normal bowel movement for a few days.  Please Note:  You might notice some irritation and congestion in your nose or some drainage.  This is from the oxygen used during your procedure.  There is no need for concern and it should clear up in a day or so.  SYMPTOMS TO REPORT IMMEDIATELY:  Following lower endoscopy (colonoscopy or flexible sigmoidoscopy):  Excessive amounts of blood in the stool  Significant tenderness or worsening of abdominal pains  Swelling of the abdomen that is new, acute  Fever of 100F or higher  For urgent or emergent issues, a gastroenterologist can be reached at any hour by calling [phone]. Do not use MyChart messaging for urgent concerns.    DIET:  We do recommend a small meal at first, but then you may proceed to your regular diet.  Drink plenty of fluids but you should  avoid alcoholic beverages for 24 hours.  ACTIVITY:  You should plan to take it easy for the rest of today and you should NOT DRIVE or use heavy machinery until tomorrow (because of the sedation medicines used during the test).    FOLLOW UP: Our staff will call the number listed on your records the next business day following your procedure.  We will call around 7:15- 8:00 am to check on you and address any questions or concerns that you may have regarding the information given to you following your procedure. If we do not reach you, we will leave a message.     If any biopsies were taken you will be contacted by phone or by letter within the next 1-3 weeks.  Please call us at [phone] if you have not heard about the biopsies in 3 weeks.    SIGNATURES/CONFIDENTIALITY: You and/or your care partner have signed paperwork which will be entered into your electronic medical record.  These signatures attest to the fact that that the information above on your After Visit Summary has been reviewed and is understood.  Full responsibility of the confidentiality of this discharge information lies with you and/or your care-partner.

## 2022-06-06 NOTE — Op Note (Signed)
[facility] Patient Name: Veronica Leonard Procedure Date: 06/06/2022 8:03 AM MRN: [id number] Endoscopist: Napoleon Form , MD, [id number] Age: 67 Referring MD:  Date of Birth: 12-13-1955 Gender: Female Account #: [id number] Procedure:                Colonoscopy Indications:              Screening for colorectal malignant neoplasm Medicines:                Monitored Anesthesia Care Procedure:                Pre-Anesthesia Assessment:                           - Prior to the procedure, a History and Physical                            was performed, and patient medications and                            allergies were reviewed. The patient's tolerance of                            previous anesthesia was also reviewed. The risks                            and benefits of the procedure and the sedation                            options and risks were discussed with the patient.                            All questions were answered, and informed consent                            was obtained. Prior Anticoagulants: The patient has                            taken no anticoagulant or antiplatelet agents. ASA                            Grade Assessment: II - A patient with mild systemic                            disease. After reviewing the risks and benefits,                            the patient was deemed in satisfactory condition to                            undergo the procedure.                           After obtaining informed consent, the colonoscope  was passed under direct vision. Throughout the                            procedure, the patient's blood pressure, pulse, and                            oxygen saturations were monitored continuously. The                            Olympus PCF-H190DL ([id number]) Colonoscope was                            introduced through the anus and advanced to the the                            cecum,  identified by appendiceal orifice and                            ileocecal valve. The colonoscopy was performed                            without difficulty. The patient tolerated the                            procedure well. The quality of the bowel                            preparation was good. The ileocecal valve,                            appendiceal orifice, and rectum were photographed. Scope In: 8:19:34 AM Scope Out: 8:32:43 AM Scope Withdrawal Time: 0 hours 9 minutes 45 seconds  Total Procedure Duration: 0 hours 13 minutes 9 seconds  Findings:                 The perianal and digital rectal examinations were                            normal.                           A 10 mm polyp was found in the ascending colon. The                            polyp was sessile. The polyp was removed with a                            cold snare. Resection and retrieval were complete.                           Scattered large-mouthed, medium-mouthed and                            small-mouthed diverticula were found in the sigmoid  colon, descending colon and transverse colon.                           Non-bleeding external and internal hemorrhoids were                            found during retroflexion. The hemorrhoids were                            medium-sized. Complications:            No immediate complications. Estimated Blood Loss:     Estimated blood loss was minimal. Impression:               - One 10 mm polyp in the ascending colon, removed                            with a cold snare. Resected and retrieved.                           - Diverticulosis in the sigmoid colon, in the                            descending colon and in the transverse colon.                           - Non-bleeding external and internal hemorrhoids. Recommendation:           - Patient has a contact number available for                            emergencies. The signs and  symptoms of potential                            delayed complications were discussed with the                            patient. Return to normal activities tomorrow.                            Written discharge instructions were provided to the                            patient.                           - Resume previous diet.                           - Continue present medications.                           - Await pathology results.                           - Repeat colonoscopy in 3 - 5 years for  surveillance based on pathology results. Napoleon Form, MD 06/06/2022 8:45:31 AM This report has been signed electronically.

## 2022-06-06 NOTE — Progress Notes (Signed)
Called to room to assist during endoscopic procedure.  Patient ID and intended procedure confirmed with present staff. Received instructions for my participation in the procedure from the performing physician.  

## 2022-06-06 NOTE — Progress Notes (Signed)
Uneventful anesthetic. Report to pacu rn. Vss. Care resumed by rn. 

## 2022-06-06 NOTE — Progress Notes (Signed)
VS by DT  Pt's states no medical or surgical changes since previsit or office visit.  

## 2022-06-06 NOTE — Progress Notes (Signed)
[facility] History and Physical   Primary Care Physician:  Carlean Jews, NP   Reason for Procedure:  Colorectal cancer screening  Plan:    Screening colonoscopy with possible interventions as needed     HPI: Veronica Leonard is a very pleasant 67 y.o. female here for screening colonoscopy. Denies any nausea, vomiting, abdominal pain, melena or bright red blood per rectum  The risks and benefits as well as alternatives of endoscopic procedure(s) have been discussed and reviewed. All questions answered. The patient agrees to proceed.    Past Medical History:  Diagnosis Date   Hyperlipidemia    Seasonal allergies     History reviewed. No pertinent surgical history.  Prior to Admission medications   Medication Sig Start Date End Date Taking? Authorizing Provider  atorvastatin (LIPITOR) 10 MG tablet Take 1 tablet (10 mg total) by mouth daily. 09/28/21  Yes Carlean Jews, NP  Cholecalciferol (VITAMIN D3) 2000 units TABS Take 2,000 Units daily by mouth.   Yes [provider]  levocetirizine (XYZAL) 5 MG tablet Take 5 mg by mouth every evening.   Yes [provider]  Magnesium Gluconate 550 MG TABS Take by mouth. 06/25/18  Yes [provider]  acyclovir (ZOVIRAX) 400 MG tablet Take 400 mg by mouth 3 (three) times daily. 04/05/22   [provider]  cetirizine (ZYRTEC) 10 MG tablet Take 10 mg by mouth daily.    [provider]    Current Outpatient Medications  Medication Sig Dispense Refill   atorvastatin (LIPITOR) 10 MG tablet Take 1 tablet (10 mg total) by mouth daily. 90 tablet 3   Cholecalciferol (VITAMIN D3) 2000 units TABS Take 2,000 Units daily by mouth.     levocetirizine (XYZAL) 5 MG tablet Take 5 mg by mouth every evening.     Magnesium Gluconate 550 MG TABS Take by mouth.     acyclovir (ZOVIRAX) 400 MG tablet Take 400 mg by mouth 3 (three) times daily.     cetirizine (ZYRTEC) 10 MG tablet Take 10 mg by  mouth daily.     Current Facility-Administered Medications  Medication Dose Route Frequency Provider Last Rate Last Admin   0.9 %  sodium chloride infusion  500 mL Intravenous Once Napoleon Form, MD        Allergies as of 06/06/2022 - Review Complete 06/06/2022  Allergen Reaction Noted   Other  03/07/2020    Family History  Problem Relation Age of Onset   Diabetes Father    Hypertension Sister    Heart disease Sister    Heart murmur Sister    Esophageal cancer Brother    Diabetes Brother    Heart murmur Brother    Breast cancer Neg Hx    Colon cancer Neg Hx    Colon polyps Neg Hx    Rectal cancer Neg Hx    Stomach cancer Neg Hx     Social History   Socioeconomic History   Marital status: Married    Spouse name: Not on file   Number of children: Not on file   Years of education: Not on file   Highest education level: Not on file  Occupational History   Not on file  Tobacco Use   Smoking status: Never   Smokeless tobacco: Never  Vaping Use   Vaping Use: Never used  Substance and Sexual Activity   Alcohol use: Yes   Drug use: No   Sexual activity: Not Currently  Other Topics Concern  Not on file  Social History Narrative   Not on file   Social Determinants of Health   Financial Resource Strain: Not on file  Food Insecurity: Not on file  Transportation Needs: Not on file  Physical Activity: Not on file  Stress: Not on file  Social Connections: Not on file  Intimate Partner Violence: Not on file    Review of Systems:  All other review of systems negative except as mentioned in the HPI.  Physical Exam: Vital signs in last 24 hours: Blood Pressure (Abnormal) 144/66   Pulse 68   Temperature (Abnormal) 97.5 F (36.4 C)   Respiration 15   Height 5\' 3"  (1.6 m)   Weight 200 lb (90.7 kg)   Last Menstrual Period 09/14/2012   Oxygen Saturation 96%   Body Mass Index 35.43 kg/m  General:   Alert, NAD Lungs:  Clear .   Heart:  Regular rate and  rhythm Abdomen:  Soft, nontender and nondistended. Neuro/Psych:  Alert and cooperative. Normal mood and affect. A and O x 3  Reviewed labs, radiology imaging, old records and pertinent past GI work up  Patient is appropriate for planned procedure(s) and anesthesia in an ambulatory setting   K. Scherry Ran , MD [phone]

## 2022-06-07 ENCOUNTER — Telehealth: Payer: Self-pay | Admitting: *Deleted

## 2022-06-07 NOTE — Telephone Encounter (Signed)
Attempted f/u phone call. No answer. Left message. °

## 2022-06-10 ENCOUNTER — Encounter: Payer: Self-pay | Admitting: Gastroenterology

## 2022-09-23 ENCOUNTER — Other Ambulatory Visit: Payer: Self-pay | Admitting: Nurse Practitioner

## 2022-09-23 DIAGNOSIS — E782 Mixed hyperlipidemia: Secondary | ICD-10-CM

## 2022-10-04 ENCOUNTER — Ambulatory Visit (INDEPENDENT_AMBULATORY_CARE_PROVIDER_SITE_OTHER): Payer: Medicare HMO | Admitting: Family Medicine

## 2022-10-04 ENCOUNTER — Encounter: Payer: Self-pay | Admitting: Family Medicine

## 2022-10-04 VITALS — BP 111/76 | HR 67 | Resp 20 | Ht 63.0 in | Wt 203.0 lb

## 2022-10-04 DIAGNOSIS — E559 Vitamin D deficiency, unspecified: Secondary | ICD-10-CM

## 2022-10-04 DIAGNOSIS — R7301 Impaired fasting glucose: Secondary | ICD-10-CM | POA: Diagnosis not present

## 2022-10-04 DIAGNOSIS — K30 Functional dyspepsia: Secondary | ICD-10-CM | POA: Insufficient documentation

## 2022-10-04 DIAGNOSIS — Z6835 Body mass index (BMI) 35.0-35.9, adult: Secondary | ICD-10-CM | POA: Diagnosis not present

## 2022-10-04 DIAGNOSIS — Z0000 Encounter for general adult medical examination without abnormal findings: Secondary | ICD-10-CM

## 2022-10-04 DIAGNOSIS — H6123 Impacted cerumen, bilateral: Secondary | ICD-10-CM | POA: Insufficient documentation

## 2022-10-04 DIAGNOSIS — E782 Mixed hyperlipidemia: Secondary | ICD-10-CM | POA: Diagnosis not present

## 2022-10-04 DIAGNOSIS — H6120 Impacted cerumen, unspecified ear: Secondary | ICD-10-CM | POA: Insufficient documentation

## 2022-10-04 MED ORDER — DEBROX 6.5 % OT SOLN: 10.0000 [drp] | Freq: Two times a day (BID) | OTIC | 0 refills | Status: DC

## 2022-10-04 MED ORDER — ATORVASTATIN CALCIUM 10 MG PO TABS: 10.0000 mg | ORAL_TABLET | Freq: Every day | ORAL | 0 refills | Status: DC

## 2022-10-04 NOTE — Assessment & Plan Note (Signed)
Patient has never been to gastroenterology for evaluation.  She states that it does not happen frequently enough that she is particularly concerned with her right now.  We discussed that it would be reasonable to see gastroenterology for possible imaging or endoscopy to evaluate for any esophageal abnormalities.  Patient verbalized understanding, would prefer watchful waiting for now.

## 2022-10-04 NOTE — Patient Instructions (Signed)
Keep up the fantastic work!

## 2022-10-04 NOTE — Assessment & Plan Note (Signed)
Last lipid panel: LDL 142, HDL 46, triglycerides 197.  Repeating lipid panel today.  Continue atorvastatin 10 mg daily or increase dose depending on results of lipid panel.  Will continue to monitor.

## 2022-10-04 NOTE — Assessment & Plan Note (Signed)
Recommend Debrox drops daily in ears, patient has a history of cerumen impaction in both ears.  Patient has a history of developing vertigo with ear flushing, trial of Debrox drops prior to ear flushing in office.

## 2022-10-04 NOTE — Progress Notes (Signed)
Complete physical exam  Patient: Veronica Leonard   DOB: 1955-10-05   67 y.o. Female  MRN: [id number]  Subjective:    Chief Complaint  Patient presents with   Annual Exam    fasting    Veronica Leonard is a 67 y.o. female who presents today for a complete physical exam. She reports consuming a general diet.  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  She generally feels well. She reports sleeping well. She does not have additional problems to discuss today.    Most recent fall risk assessment:    10/04/2022    9:59 AM  Fall Risk   Falls in the past year? 0  Number falls in past yr: 0  Injury with Fall? 0  Risk for fall due to : No Fall Risks  Follow up Falls evaluation completed     Most recent depression and anxiety screenings:    10/04/2022    9:59 AM 09/14/2021    8:34 AM  PHQ 2/9 Scores  PHQ - 2 Score 0 0  PHQ- 9 Score 0 1      10/04/2022    9:59 AM 09/14/2021    8:34 AM 09/13/2020    8:52 AM  GAD 7 : Generalized Anxiety Score  Nervous, Anxious, on Edge 0 0 0  Control/stop worrying 0 0 0  Worry too much - different things 0 0 0  Trouble relaxing 0 0 0  Restless 0 0 0  Easily annoyed or irritable 1 0 0  Afraid - awful might happen 1 0 0  Total GAD 7 Score 2 0 0  Anxiety Difficulty Not difficult at all      Patient Active Problem List   Diagnosis Date Noted   Excessive cerumen in both ear canals 10/04/2022   Indigestion 10/04/2022   Vitamin D deficiency 09/14/2021   Impaired fasting glucose 09/14/2021   Severe obesity (BMI 35.0-35.9 with comorbidity) (HCC) 09/20/2020   Breast nodule 11/29/2016   Hyperlipidemia 08/29/2016   Seasonal allergies    Anxiety    Benign colonic polyp     History reviewed. No pertinent surgical history. Social History   Tobacco Use   Smoking status: Never   Smokeless tobacco: Never  Vaping Use   Vaping status: Never Used  Substance Use Topics   Alcohol use: Yes   Drug  use: No   Family History  Problem Relation Age of Onset   Diabetes Father    Hypertension Sister    Heart disease Sister    Heart murmur Sister    Esophageal cancer Brother    Diabetes Brother    Heart murmur Brother    Breast cancer Neg Hx    Colon cancer Neg Hx    Colon polyps Neg Hx    Rectal cancer Neg Hx    Stomach cancer Neg Hx    Allergies  Allergen Reactions   Other      Patient Care Team: Melida Quitter, PA as PCP - General (Family Medicine) Harrington Challenger, NP (Inactive) as Nurse Practitioner (Obstetrics and Gynecology)   Outpatient Medications Prior to Visit  Medication Sig   acyclovir (ZOVIRAX) 400 MG tablet Take 400 mg by mouth 3 (three) times daily.   cetirizine (ZYRTEC) 10 MG tablet Take 10 mg by mouth daily.   Cholecalciferol (VITAMIN D3) 2000 units TABS Take 2,000 Units daily by mouth.   levocetirizine (XYZAL) 5 MG tablet  Take 5 mg by mouth every evening.   Magnesium Gluconate 550 MG TABS Take by mouth.   [DISCONTINUED] atorvastatin (LIPITOR) 10 MG tablet Take 1 tablet (10 mg total) by mouth daily.   No facility-administered medications prior to visit.    Review of Systems  Constitutional:  Negative for chills, fever and malaise/fatigue.  HENT:  Negative for congestion and hearing loss.   Eyes:  Negative for blurred vision and double vision.  Respiratory:  Negative for cough and shortness of breath.   Cardiovascular:  Negative for chest pain, palpitations and leg swelling.  Gastrointestinal:  Negative for abdominal pain, constipation, diarrhea and heartburn.       Episodic "indigestion" where when she swallows, it feels like it gets stuck in the epigastric region and "goes down like a brick"  Genitourinary:  Negative for frequency and urgency.  Musculoskeletal:  Negative for myalgias and neck pain.  Neurological:  Negative for headaches.  Endo/Heme/Allergies:  Negative for polydipsia.  Psychiatric/Behavioral:  Negative for depression. The patient  is not nervous/anxious and does not have insomnia.       Objective:    BP 111/76 (BP Location: Right Arm, Patient Position: Sitting, Cuff Size: Large)   Pulse 67   Resp 20   Ht 5\' 3"  (1.6 m)   Wt 203 lb (92.1 kg)   LMP 09/14/2012   SpO2 97%   BMI 35.96 kg/m    Physical Exam Constitutional:      General: She is not in acute distress.    Appearance: Normal appearance.  HENT:     Head: Normocephalic and atraumatic.     Right Ear: External ear normal. There is impacted cerumen.     Left Ear: External ear normal. There is impacted cerumen.     Nose: Nose normal. No congestion or rhinorrhea.     Mouth/Throat:     Mouth: Mucous membranes are moist.     Pharynx: No oropharyngeal exudate or posterior oropharyngeal erythema.  Eyes:     Extraocular Movements: Extraocular movements intact.     Conjunctiva/sclera: Conjunctivae normal.     Pupils: Pupils are equal, round, and reactive to light.     Comments: Wears glasses, prescription up-to-date  Neck:     Thyroid: No thyroid mass, thyromegaly or thyroid tenderness.  Cardiovascular:     Rate and Rhythm: Normal rate and regular rhythm.     Heart sounds: Normal heart sounds. No murmur heard.    No friction rub. No gallop.  Pulmonary:     Effort: Pulmonary effort is normal. No respiratory distress.     Breath sounds: Normal breath sounds. No wheezing, rhonchi or rales.  Abdominal:     General: Abdomen is flat. Bowel sounds are normal. There is no distension.     Palpations: There is no mass.     Tenderness: There is no abdominal tenderness. There is no guarding.  Musculoskeletal:        General: Normal range of motion.     Cervical back: Normal range of motion and neck supple.  Lymphadenopathy:     Cervical: No cervical adenopathy.  Skin:    General: Skin is warm and dry.  Neurological:     Mental Status: She is alert and oriented to person, place, and time.     Cranial Nerves: No cranial nerve deficit.     Motor: No weakness.      Deep Tendon Reflexes: Reflexes normal.  Psychiatric:        Mood and Affect:  Mood normal.       Assessment & Plan:    Routine Health Maintenance and Physical Exam  Immunization History  Administered Date(s) Administered   Influenza, High Dose Seasonal PF 09/19/2022   Influenza,inj,Quad PF,6+ Mos 11/14/2012, 11/29/2016   Influenza-Unspecified 11/18/2021   PFIZER(Purple Top)SARS-COV-2 Vaccination 03/22/2019, 04/12/2019, 10/24/2019, 01/28/2021, 11/18/2021   PNEUMOCOCCAL CONJUGATE-20 09/19/2022   Tdap 03/10/2016   Zoster Recombinant(Shingrix) 10/30/2018, 01/07/2019    Health Maintenance  Topic Date Due   Medicare Annual Wellness (AWV)  Never done   Cervical Cancer Screening (HPV/Pap Cotest)  06/20/2020   DEXA SCAN  Never done   COVID-19 Vaccine (6 - 2023-24 season) 09/17/2022   MAMMOGRAM  12/08/2023   Colonoscopy  06/05/2025   DTaP/Tdap/Td (2 - Td or Tdap) 03/10/2026   Pneumonia Vaccine 25+ Years old  Completed   INFLUENZA VACCINE  Completed   Hepatitis C Screening  Completed   HIV Screening  Completed   Zoster Vaccines- Shingrix  Completed   HPV VACCINES  Aged Out    Repeating labs including CBC, CMP, lipid panel, A1C, TSH, and vitamin D.  Received pneumonia and shingles vaccines at [facility] earlier this month, requesting copies. Upcoming Pap smear with [facility] next week, they will also order DEXA scan.  Discussed health benefits of physical activity, and encouraged her to engage in regular exercise appropriate for her age and condition.  Wellness examination  Mixed hyperlipidemia Assessment & Plan: Last lipid panel: LDL 142, HDL 46, triglycerides 197.  Repeating lipid panel today.  Continue atorvastatin 10 mg daily or increase dose depending on results of lipid panel.  Will continue to monitor.  Orders: -     CBC with Differential/Platelet; Future -     Comprehensive metabolic panel; Future -     Lipid panel; Future -     Atorvastatin Calcium; Take 1  tablet (10 mg total) by mouth daily.  Dispense: 90 tablet; Refill: 0  Severe obesity (BMI 35.0-35.9 with comorbidity) (HCC) -     CBC with Differential/Platelet; Future -     Comprehensive metabolic panel; Future -     Hemoglobin A1c; Future -     Lipid panel; Future -     TSH Rfx on Abnormal to Free T4; Future -     VITAMIN D 25 Hydroxy (Vit-D Deficiency, Fractures); Future  Vitamin D deficiency -     VITAMIN D 25 Hydroxy (Vit-D Deficiency, Fractures); Future  Impaired fasting glucose -     Hemoglobin A1c; Future  Excessive cerumen in both ear canals Assessment & Plan: Recommend Debrox drops daily in ears, patient has a history of cerumen impaction in both ears.  Patient has a history of developing vertigo with ear flushing, trial of Debrox drops prior to ear flushing in office.  Orders: -     Debrox; Place 10 drops into both ears 2 (two) times daily. Dispense 1 bottle  Dispense: 1 mL; Refill: 0  Indigestion Assessment & Plan: Patient has never been to gastroenterology for evaluation.  She states that it does not happen frequently enough that she is particularly concerned with her right now.  We discussed that it would be reasonable to see gastroenterology for possible imaging or endoscopy to evaluate for any esophageal abnormalities.  Patient verbalized understanding, would prefer watchful waiting for now.     Return in about 6 months (around 04/03/2023) for follow-up for HLD, fasting blood work 1 week before.     Melida Quitter, PA

## 2022-10-05 ENCOUNTER — Other Ambulatory Visit: Payer: Self-pay | Admitting: Family Medicine

## 2022-10-05 DIAGNOSIS — E782 Mixed hyperlipidemia: Secondary | ICD-10-CM

## 2022-10-05 LAB — COMPREHENSIVE METABOLIC PANEL
ALT: 25 IU/L (ref 0–32)
AST: 23 IU/L (ref 0–40)
Albumin: 4.5 g/dL (ref 3.9–4.9)
Alkaline Phosphatase: 89 IU/L (ref 44–121)
BUN/Creatinine Ratio: 18 (ref 12–28)
BUN: 14 mg/dL (ref 8–27)
Bilirubin Total: 0.6 mg/dL (ref 0.0–1.2)
CO2: 25 mmol/L (ref 20–29)
Calcium: 9.9 mg/dL (ref 8.7–10.3)
Chloride: 98 mmol/L (ref 96–106)
Creatinine, Ser: 0.76 mg/dL (ref 0.57–1.00)
Globulin, Total: 2.5 g/dL (ref 1.5–4.5)
Glucose: 103 mg/dL — ABNORMAL HIGH (ref 70–99)
Potassium: 4.7 mmol/L (ref 3.5–5.2)
Sodium: 138 mmol/L (ref 134–144)
Total Protein: 7 g/dL (ref 6.0–8.5)
eGFR: 87 mL/min/{1.73_m2} (ref >59)

## 2022-10-05 LAB — LIPID PANEL
Chol/HDL Ratio: 5 ratio — ABNORMAL HIGH (ref 0.0–4.4)
Cholesterol, Total: 243 mg/dL — ABNORMAL HIGH (ref 100–199)
HDL: 49 mg/dL (ref >39)
LDL Chol Calc (NIH): 163 mg/dL — ABNORMAL HIGH (ref 0–99)
Triglycerides: 171 mg/dL — ABNORMAL HIGH (ref 0–149)
VLDL Cholesterol Cal: 31 mg/dL (ref 5–40)

## 2022-10-05 LAB — CBC WITH DIFFERENTIAL/PLATELET
Basophils Absolute: 0.1 10*3/uL (ref 0.0–0.2)
Basos: 1 %
EOS (ABSOLUTE): 0.3 10*3/uL (ref 0.0–0.4)
Eos: 4 %
Hematocrit: 43.6 % (ref 34.0–46.6)
Hemoglobin: 14.1 g/dL (ref 11.1–15.9)
Immature Grans (Abs): 0.1 10*3/uL (ref 0.0–0.1)
Immature Granulocytes: 1 %
Lymphocytes Absolute: 1.5 10*3/uL (ref 0.7–3.1)
Lymphs: 22 %
MCH: 29.2 pg (ref 26.6–33.0)
MCHC: 32.3 g/dL (ref 31.5–35.7)
MCV: 90 fL (ref 79–97)
Monocytes Absolute: 0.5 10*3/uL (ref 0.1–0.9)
Monocytes: 7 %
Neutrophils Absolute: 4.7 10*3/uL (ref 1.4–7.0)
Neutrophils: 65 %
Platelets: 264 10*3/uL (ref 150–450)
RBC: 4.83 x10E6/uL (ref 3.77–5.28)
RDW: 12.4 % (ref 11.7–15.4)
WBC: 7.1 10*3/uL (ref 3.4–10.8)

## 2022-10-05 LAB — HEMOGLOBIN A1C
Est. average glucose Bld gHb Est-mCnc: 128 mg/dL
Hgb A1c MFr Bld: 6.1 % — ABNORMAL HIGH (ref 4.8–5.6)

## 2022-10-05 LAB — TSH RFX ON ABNORMAL TO FREE T4: TSH: 1.58 u[IU]/mL (ref 0.450–4.500)

## 2022-10-05 LAB — VITAMIN D 25 HYDROXY (VIT D DEFICIENCY, FRACTURES): Vit D, 25-Hydroxy: 43.5 ng/mL (ref 30.0–100.0)

## 2022-10-05 MED ORDER — ATORVASTATIN CALCIUM 20 MG PO TABS
20.0000 mg | ORAL_TABLET | Freq: Every day | ORAL | 3 refills | Status: DC
Start: 2022-10-31 — End: 2022-11-23

## 2022-10-09 DIAGNOSIS — Z6836 Body mass index (BMI) 36.0-36.9, adult: Secondary | ICD-10-CM | POA: Diagnosis not present

## 2022-10-09 DIAGNOSIS — Z124 Encounter for screening for malignant neoplasm of cervix: Secondary | ICD-10-CM | POA: Diagnosis not present

## 2022-10-10 LAB — HM PAP SMEAR: HM Pap smear: NEGATIVE

## 2022-10-24 ENCOUNTER — Other Ambulatory Visit: Payer: Self-pay | Admitting: Family Medicine

## 2022-10-24 DIAGNOSIS — Z1231 Encounter for screening mammogram for malignant neoplasm of breast: Secondary | ICD-10-CM

## 2022-10-25 ENCOUNTER — Telehealth: Payer: Self-pay

## 2022-10-25 NOTE — Telephone Encounter (Signed)
-----   Message from Melida Quitter sent at 10/25/2022  9:34 AM EDT ----- Please request pap and DEXA from [facility]  Thank you!!!

## 2022-10-25 NOTE — Telephone Encounter (Signed)
Request has been submitted

## 2022-11-23 ENCOUNTER — Encounter: Payer: Self-pay | Admitting: Family Medicine

## 2022-11-23 ENCOUNTER — Ambulatory Visit (INDEPENDENT_AMBULATORY_CARE_PROVIDER_SITE_OTHER): Payer: Medicare HMO | Admitting: Family Medicine

## 2022-11-23 ENCOUNTER — Ambulatory Visit: Payer: Medicare HMO | Attending: Family Medicine

## 2022-11-23 VITALS — BP 123/79 | HR 70 | Resp 18 | Ht 63.0 in | Wt 204.0 lb

## 2022-11-23 DIAGNOSIS — R9431 Abnormal electrocardiogram [ECG] [EKG]: Secondary | ICD-10-CM | POA: Diagnosis not present

## 2022-11-23 DIAGNOSIS — H6123 Impacted cerumen, bilateral: Secondary | ICD-10-CM | POA: Diagnosis not present

## 2022-11-23 DIAGNOSIS — E782 Mixed hyperlipidemia: Secondary | ICD-10-CM | POA: Diagnosis not present

## 2022-11-23 DIAGNOSIS — Z0000 Encounter for general adult medical examination without abnormal findings: Secondary | ICD-10-CM

## 2022-11-23 MED ORDER — ATORVASTATIN CALCIUM 20 MG PO TABS: 20.0000 mg | ORAL_TABLET | Freq: Every day | ORAL | 3 refills | Status: DC

## 2022-11-23 MED ORDER — DEBROX 6.5 % OT SOLN: 10.0000 [drp] | Freq: Two times a day (BID) | OTIC | 0 refills | Status: AC

## 2022-11-23 NOTE — Progress Notes (Signed)
Subjective:    Veronica Leonard is a 67 y.o. female who presents for a Welcome to Medicare exam.   Cardiac Risk Factors include: none      Objective:    Today's Vitals   11/23/22 1601 11/23/22 1604  BP: 123/79   Pulse: 70   Resp: 18   SpO2: 98%   Weight: 204 lb (92.5 kg)   Height: 5\' 3"  (1.6 m)   PainSc: 0-No pain 0-No pain  Body mass index is 36.14 kg/m.  Medications Outpatient Encounter Medications as of 11/23/2022  Medication Sig   acyclovir (ZOVIRAX) 400 MG tablet Take 400 mg by mouth 3 (three) times daily.   atorvastatin (LIPITOR) 20 MG tablet Take 1 tablet (20 mg total) by mouth daily.   carbamide peroxide (DEBROX) 6.5 % OTIC solution Place 10 drops into both ears 2 (two) times daily. Dispense 1 bottle   cetirizine (ZYRTEC) 10 MG tablet Take 10 mg by mouth daily.   Cholecalciferol (VITAMIN D3) 2000 units TABS Take 2,000 Units daily by mouth.   levocetirizine (XYZAL) 5 MG tablet Take 5 mg by mouth every evening.   Magnesium Gluconate 550 MG TABS Take by mouth.   [DISCONTINUED] atorvastatin (LIPITOR) 20 MG tablet Take 1 tablet (20 mg total) by mouth daily.   [DISCONTINUED] carbamide peroxide (DEBROX) 6.5 % OTIC solution Place 10 drops into both ears 2 (two) times daily. Dispense 1 bottle   No facility-administered encounter medications on file as of 11/23/2022.     History: Past Medical History:  Diagnosis Date   Hyperlipidemia    Seasonal allergies    History reviewed. No pertinent surgical history.  Family History  Problem Relation Age of Onset   Diabetes Father    Hypertension Sister    Heart disease Sister    Heart murmur Sister    Esophageal cancer Brother    Diabetes Brother    Heart murmur Brother    Breast cancer Neg Hx    Colon cancer Neg Hx    Colon polyps Neg Hx    Rectal cancer Neg Hx    Stomach cancer Neg Hx    Social History   Occupational History   Not on file  Tobacco Use   Smoking status: Never    Passive exposure: Never    Smokeless tobacco: Never  Vaping Use   Vaping status: Never Used  Substance and Sexual Activity   Alcohol use: Yes   Drug use: No   Sexual activity: Not Currently    Tobacco Counseling Counseling given: Not Answered   Immunizations and Health Maintenance Immunization History  Administered Date(s) Administered   Influenza, High Dose Seasonal PF 09/19/2022   Influenza,inj,Quad PF,6+ Mos 11/14/2012, 11/29/2016   Influenza-Unspecified 11/18/2021   PFIZER(Purple Top)SARS-COV-2 Vaccination 03/22/2019, 04/12/2019, 10/24/2019, 01/28/2021, 11/18/2021   PNEUMOCOCCAL CONJUGATE-20 09/19/2022   Tdap 03/10/2016   Zoster Recombinant(Shingrix) 10/30/2018, 01/07/2019   Health Maintenance Due  Topic Date Due   DEXA SCAN  Never done   COVID-19 Vaccine (6 - 2023-24 season) 09/17/2022    Activities of Daily Living    11/23/2022    4:06 PM  In your present state of health, do you have any difficulty performing the following activities:  Hearing? 0  Vision? 0  Difficulty concentrating or making decisions? 0  Walking or climbing stairs? 0  Dressing or bathing? 0  Doing errands, shopping? 0  Preparing Food and eating ? N  Using the Toilet? N  In the past six months, have you  accidently leaked urine? N  Do you have problems with loss of bowel control? N  Managing your Medications? N  Managing your Finances? N  Housekeeping or managing your Housekeeping? N    Physical Exam   Physical Exam HENT:     Right Ear: There is impacted cerumen.     Left Ear: There is impacted cerumen.  Cardiovascular:     Rate and Rhythm: Normal rate and regular rhythm.     Heart sounds: No murmur heard.    No friction rub. No gallop.  Pulmonary:     Effort: Pulmonary effort is normal. No respiratory distress.     Breath sounds: Normal breath sounds.      Advanced Directives: Does Patient Have a Medical Advance Directive?: Yes Type of Advance Directive: Living will Does patient want to make changes to  medical advance directive?: No - Patient declined  EKG:  there are no previous tracings available for comparison, normal sinus rhythm, possible previous infarct      Assessment:    This is a routine wellness examination for this patient .   Vision/Hearing screen Hearing Screening - Comments:: N/A Vision Screening - Comments:: Pt established with Ophthalmology.    Goals      Activity and Exercise Increased     Evidence-based guidance:  Review current exercise levels.  Assess patient perspective on exercise or activity level, barriers to increasing activity, motivation and readiness for change.  Recommend or set healthy exercise goal based on individual tolerance.  Encourage small steps toward making change in amount of exercise or activity.  Urge reduction of sedentary activities or screen time.  Promote group activities within the community or with family or support person.  Consider referral to rehabiliation therapist for assessment and exercise/activity plan.   Notes:        Depression Screen    11/23/2022    4:02 PM 10/04/2022    9:59 AM 09/14/2021    8:34 AM 09/13/2020    8:52 AM  PHQ 2/9 Scores  PHQ - 2 Score 0 0 0 0  PHQ- 9 Score 0 0 1 0     Fall Risk    11/23/2022    4:06 PM  Fall Risk   Falls in the past year? 0  Number falls in past yr: 0  Injury with Fall? 0  Risk for fall due to : No Fall Risks  Follow up Falls evaluation completed    Cognitive Function:        11/23/2022    4:05 PM  6CIT Screen  What Year? 0 points  What month? 0 points  What time? 0 points  Count back from 20 0 points  Months in reverse 0 points  Repeat phrase 2 points  Total Score 2 points    Patient Care Team: Melida Quitter, PA as PCP - General (Family Medicine) Harrington Challenger, NP (Inactive) as Nurse Practitioner (Obstetrics and Gynecology)     Plan:   DEXA scan has been ordered by [facility]. ZIO monitor has been ordered to confirm changes noted on  EKG of possible previous MI.  If results confirm previous infarct, may consider CAC or cardiology referral. Recommend adding coq.10 supplement to alleviate cramping with increased dose of atorvastatin.  Also sent Debrox eardrops and suggested at home ear flushing kit.  I have personally reviewed and noted the following in the patient's chart:   Medical and social history Use of alcohol, tobacco or illicit drugs  Current  medications and supplements Functional ability and status Nutritional status Physical activity Advanced directives List of other physicians Hospitalizations, surgeries, and ER visits in previous 12 months Vitals Screenings to include cognitive, depression, and falls Referrals and appointments  In addition, I have reviewed and discussed with patient certain preventive protocols, quality metrics, and best practice recommendations. A written personalized care plan for preventive services as well as general preventive health recommendations were provided to patient.     Melida Quitter, PA 11/23/2022

## 2022-11-23 NOTE — Assessment & Plan Note (Deleted)
The 10-year ASCVD risk score (Arnett DK, et al., 2019) is: 6%

## 2022-11-23 NOTE — Progress Notes (Unsigned)
EP to read

## 2022-11-23 NOTE — Patient Instructions (Signed)
You can start taking a co-Q10 supplement to see if it improves the cramping with the higher dose of atorvastatin.  You should get the heart monitor in the mail within the next day or 2 and will wear it for 3 days.  I will let you know once we get the results back from the cardiologist.  I resent the correct dose of atorvastatin as well as the Debrox eardrops to [facility] for you.  You may also consider trying one of the home earwax removal kits that are available at the pharmacy.

## 2022-11-23 NOTE — Progress Notes (Deleted)
Subjective:    Veronica Leonard is a 67 y.o. female who presents for a Welcome to Medicare exam.          Objective:    There were no vitals filed for this visit.There is no height or weight on file to calculate BMI.  Medications Outpatient Encounter Medications as of 11/23/2022  Medication Sig   acyclovir (ZOVIRAX) 400 MG tablet Take 400 mg by mouth 3 (three) times daily.   atorvastatin (LIPITOR) 20 MG tablet Take 1 tablet (20 mg total) by mouth daily.   carbamide peroxide (DEBROX) 6.5 % OTIC solution Place 10 drops into both ears 2 (two) times daily. Dispense 1 bottle   cetirizine (ZYRTEC) 10 MG tablet Take 10 mg by mouth daily.   Cholecalciferol (VITAMIN D3) 2000 units TABS Take 2,000 Units daily by mouth.   levocetirizine (XYZAL) 5 MG tablet Take 5 mg by mouth every evening.   Magnesium Gluconate 550 MG TABS Take by mouth.   No facility-administered encounter medications on file as of 11/23/2022.     History: Past Medical History:  Diagnosis Date   Hyperlipidemia    Seasonal allergies    History reviewed. No pertinent surgical history.  Family History  Problem Relation Age of Onset   Diabetes Father    Hypertension Sister    Heart disease Sister    Heart murmur Sister    Esophageal cancer Brother    Diabetes Brother    Heart murmur Brother    Breast cancer Neg Hx    Colon cancer Neg Hx    Colon polyps Neg Hx    Rectal cancer Neg Hx    Stomach cancer Neg Hx    Social History   Occupational History   Not on file  Tobacco Use   Smoking status: Never   Smokeless tobacco: Never  Vaping Use   Vaping status: Never Used  Substance and Sexual Activity   Alcohol use: Yes   Drug use: No   Sexual activity: Not Currently    Tobacco Counseling Counseling given: Not Answered   Immunizations and Health Maintenance Immunization History  Administered Date(s) Administered   Influenza, High Dose Seasonal PF 09/19/2022   Influenza,inj,Quad PF,6+ Mos 11/14/2012,  11/29/2016   Influenza-Unspecified 11/18/2021   PFIZER(Purple Top)SARS-COV-2 Vaccination 03/22/2019, 04/12/2019, 10/24/2019, 01/28/2021, 11/18/2021   PNEUMOCOCCAL CONJUGATE-20 09/19/2022   Tdap 03/10/2016   Zoster Recombinant(Shingrix) 10/30/2018, 01/07/2019   Health Maintenance Due  Topic Date Due   DEXA SCAN  Never done   COVID-19 Vaccine (6 - 2023-24 season) 09/17/2022    Activities of Daily Living     No data to display           Physical Exam   Physical Exam (optional), or other factors deemed appropriate based on the beneficiary's medical and social history and current clinical standards.   Advanced Directives:    EKG:  {ekg findings:315101}      Assessment:    This is a routine wellness examination for this patient . ***  Vision/Hearing screen No results found.   Goals   None    Depression Screen    10/04/2022    9:59 AM 09/14/2021    8:34 AM 09/13/2020    8:52 AM 03/08/2020   10:14 AM  PHQ 2/9 Scores  PHQ - 2 Score 0 0 0 0  PHQ- 9 Score 0 1 0 2     Fall Risk    10/04/2022    9:59 AM  Fall Risk  Falls in the past year? 0  Number falls in past yr: 0  Injury with Fall? 0  Risk for fall due to : No Fall Risks  Follow up Falls evaluation completed    Cognitive Function:        Patient Care Team: Melida Quitter, PA as PCP - General (Family Medicine) Harrington Challenger, NP (Inactive) as Nurse Practitioner (Obstetrics and Gynecology)     Plan:   ***  I have personally reviewed and noted the following in the patient's chart:   Medical and social history Use of alcohol, tobacco or illicit drugs  Current medications and supplements Functional ability and status Nutritional status Physical activity Advanced directives List of other physicians Hospitalizations, surgeries, and ER visits in previous 12 months Vitals Screenings to include cognitive, depression, and falls Referrals and appointments  In addition, I have reviewed and  discussed with patient certain preventive protocols, quality metrics, and best practice recommendations. A written personalized care plan for preventive services as well as general preventive health recommendations were provided to patient.     Tonny Bollman, CMA 11/23/2022

## 2022-12-01 DIAGNOSIS — R9431 Abnormal electrocardiogram [ECG] [EKG]: Secondary | ICD-10-CM

## 2022-12-01 DIAGNOSIS — R000 Tachycardia, unspecified: Secondary | ICD-10-CM

## 2022-12-11 ENCOUNTER — Ambulatory Visit
Admission: RE | Admit: 2022-12-11 | Discharge: 2022-12-11 | Disposition: A | Discharge status: Home / Self Care | Payer: Medicare HMO | Source: Ambulatory Visit | Attending: Family Medicine

## 2022-12-11 DIAGNOSIS — Z1231 Encounter for screening mammogram for malignant neoplasm of breast: Secondary | ICD-10-CM

## 2022-12-11 DIAGNOSIS — R9431 Abnormal electrocardiogram [ECG] [EKG]: Secondary | ICD-10-CM | POA: Diagnosis not present

## 2022-12-11 DIAGNOSIS — I4719 Other supraventricular tachycardia: Secondary | ICD-10-CM | POA: Diagnosis not present

## 2022-12-28 ENCOUNTER — Other Ambulatory Visit: Payer: Self-pay | Admitting: Family Medicine

## 2022-12-28 DIAGNOSIS — E782 Mixed hyperlipidemia: Secondary | ICD-10-CM

## 2023-02-22 ENCOUNTER — Encounter: Payer: Self-pay | Admitting: Family Medicine

## 2023-03-13 ENCOUNTER — Other Ambulatory Visit: Payer: Self-pay

## 2023-03-13 DIAGNOSIS — R7301 Impaired fasting glucose: Secondary | ICD-10-CM

## 2023-03-13 DIAGNOSIS — E782 Mixed hyperlipidemia: Secondary | ICD-10-CM

## 2023-03-28 ENCOUNTER — Other Ambulatory Visit: Payer: Medicare HMO

## 2023-03-28 DIAGNOSIS — E782 Mixed hyperlipidemia: Secondary | ICD-10-CM

## 2023-03-28 DIAGNOSIS — R7301 Impaired fasting glucose: Secondary | ICD-10-CM

## 2023-03-30 ENCOUNTER — Other Ambulatory Visit

## 2023-03-30 DIAGNOSIS — R7301 Impaired fasting glucose: Secondary | ICD-10-CM | POA: Diagnosis not present

## 2023-03-30 DIAGNOSIS — Z6835 Body mass index (BMI) 35.0-35.9, adult: Secondary | ICD-10-CM | POA: Diagnosis not present

## 2023-03-30 DIAGNOSIS — E782 Mixed hyperlipidemia: Secondary | ICD-10-CM | POA: Diagnosis not present

## 2023-03-31 LAB — COMPREHENSIVE METABOLIC PANEL
ALT: 30 IU/L (ref 0–32)
AST: 24 IU/L (ref 0–40)
Albumin: 4.2 g/dL (ref 3.9–4.9)
Alkaline Phosphatase: 81 IU/L (ref 44–121)
BUN/Creatinine Ratio: 15 (ref 12–28)
BUN: 12 mg/dL (ref 8–27)
Bilirubin Total: 0.6 mg/dL (ref 0.0–1.2)
CO2: 25 mmol/L (ref 20–29)
Calcium: 9.5 mg/dL (ref 8.7–10.3)
Chloride: 100 mmol/L (ref 96–106)
Creatinine, Ser: 0.82 mg/dL (ref 0.57–1.00)
Globulin, Total: 2.5 g/dL (ref 1.5–4.5)
Glucose: 91 mg/dL (ref 70–99)
Potassium: 4.9 mmol/L (ref 3.5–5.2)
Sodium: 139 mmol/L (ref 134–144)
Total Protein: 6.7 g/dL (ref 6.0–8.5)
eGFR: 79 mL/min/{1.73_m2} (ref >59)

## 2023-03-31 LAB — HEMOGLOBIN A1C
Est. average glucose Bld gHb Est-mCnc: 123 mg/dL
Hgb A1c MFr Bld: 5.9 % — ABNORMAL HIGH (ref 4.8–5.6)

## 2023-03-31 LAB — LIPID PANEL
Chol/HDL Ratio: 4.6 ratio — ABNORMAL HIGH (ref 0.0–4.4)
Cholesterol, Total: 211 mg/dL — ABNORMAL HIGH (ref 100–199)
HDL: 46 mg/dL (ref >39)
LDL Chol Calc (NIH): 126 mg/dL — ABNORMAL HIGH (ref 0–99)
Triglycerides: 220 mg/dL — ABNORMAL HIGH (ref 0–149)
VLDL Cholesterol Cal: 39 mg/dL (ref 5–40)

## 2023-04-03 ENCOUNTER — Ambulatory Visit: Payer: Medicare HMO | Admitting: Family Medicine

## 2023-05-10 ENCOUNTER — Encounter: Payer: Self-pay | Admitting: Family Medicine

## 2023-05-10 ENCOUNTER — Ambulatory Visit (INDEPENDENT_AMBULATORY_CARE_PROVIDER_SITE_OTHER): Admitting: Family Medicine

## 2023-05-10 VITALS — BP 132/84 | HR 62 | Ht 63.0 in | Wt 204.1 lb

## 2023-05-10 DIAGNOSIS — E782 Mixed hyperlipidemia: Secondary | ICD-10-CM

## 2023-05-10 DIAGNOSIS — Z6835 Body mass index (BMI) 35.0-35.9, adult: Secondary | ICD-10-CM

## 2023-05-10 DIAGNOSIS — I77810 Thoracic aortic ectasia: Secondary | ICD-10-CM

## 2023-05-10 NOTE — Patient Instructions (Signed)
 It was nice to see you today,  We addressed the following topics today: -Somebody should call you to schedule the coronary CT scan.  Once it has resulted I can talk to you about the results. - If necessary we can talk about how to increase your statin dose. - For weight loss if you decide you want to do a weight loss program similar to the one your daughters are doing let us  know and I can send in a referral for that.  Have a great day,  Etha Henle, MD

## 2023-05-10 NOTE — Progress Notes (Unsigned)
   Established Patient Office Visit  Subjective   Patient ID: Veronica Leonard, female    DOB: Jan 26, 1955  Age: 68 y.o. MRN: [id number]  Chief Complaint  Patient presents with   Medical Management of Chronic Issues    HPI  Hld -patient is taking her atorvastatin  at 20 mg.  At first she noticed some significant leg pain but started taking co-Q10 and her leg pain has resolved.  She is hesitant to increase her statin dose to achieve an LDL less than 100.  She is in the low 100s currently.  We discussed other ways of further defining her ASCVD risk including coronary artery calcium  scoring.  She is agreeable to this.  Obesity/prediabetes-we discussed dietary improvements patient can make.  She has been trying to eat less carbs and more salads.  Increasing her protein intake with things like beings.  She has 2 daughters that are both separately in weight loss programs and have lost 40 or 50 pounds.  Patient is hesitant to start a weight loss program at this time.   The 10-year ASCVD risk score (Arnett DK, et al., 2019) is: 6.5%  Health Maintenance Due  Topic Date Due   DEXA SCAN  Never done   COVID-19 Vaccine (6 - 2024-25 season) 09/17/2022      Objective:     BP 132/84   Pulse 62   Ht 5\' 3"  (1.6 m)   Wt 204 lb 1.9 oz (92.6 kg)   LMP 09/14/2012   SpO2 99%   BMI 36.16 kg/m    Physical Exam General: Alert, oriented CV: Rate regular Pulmonary: Lungs clear bilaterally    No results found for any visits on 05/10/23.      Assessment & Plan:   Mixed hyperlipidemia Assessment & Plan: Patient on statin due to severe cholesterolemia with LDLs over 200 in the past.  Goal should be less than 100 for LDL.  Patient would like to further clarify her ASCVD risk before increasing statin due to the side effects she experienced with the last time she increased her statin. - Coronary artery calcium  testing sent in.   Orders: -     CT CARDIAC SCORING (SELF PAY ONLY);  Future  Severe obesity (BMI 35.0-35.9 with comorbidity) (HCC) Assessment & Plan: Discussed the importance of calorie reduction with patient.  Discussed how guided weight loss programs can help with accountability and adherence to dietary program.  She we will hold off on referral at this time.      Return in about 6 months (around 11/09/2023) for hld.    Laneta Pintos, MD

## 2023-05-11 NOTE — Assessment & Plan Note (Signed)
 Patient on statin due to severe cholesterolemia with LDLs over 200 in the past.  Goal should be less than 100 for LDL.  Patient would like to further clarify her ASCVD risk before increasing statin due to the side effects she experienced with the last time she increased her statin. - Coronary artery calcium  testing sent in.

## 2023-05-11 NOTE — Assessment & Plan Note (Signed)
 Discussed the importance of calorie reduction with patient.  Discussed how guided weight loss programs can help with accountability and adherence to dietary program.  She we will hold off on referral at this time.

## 2023-06-15 ENCOUNTER — Ambulatory Visit (HOSPITAL_COMMUNITY)
Admission: RE | Admit: 2023-06-15 | Discharge: 2023-06-15 | Disposition: A | Discharge status: Home / Self Care | Payer: Self-pay | Source: Ambulatory Visit | Attending: Family Medicine | Admitting: Family Medicine

## 2023-06-15 ENCOUNTER — Encounter (HOSPITAL_COMMUNITY): Payer: Self-pay

## 2023-06-15 DIAGNOSIS — E782 Mixed hyperlipidemia: Secondary | ICD-10-CM | POA: Insufficient documentation

## 2023-06-18 ENCOUNTER — Ambulatory Visit: Payer: Self-pay | Admitting: Family Medicine

## 2023-06-18 NOTE — Telephone Encounter (Signed)
 Copied from CRM [id number]. Topic: General - Call Back - No Documentation >> Jun 18, 2023 10:26 AM [name] wrote: Reason for CRM: Patient is returning call to Katrina about  test results Veronica Leonard, RMA    06/18/23  8:32 AM Result Note Called pt LVM  to contact the office CT CARDIAC SCORING (SELF PAY ONLY) Provided her with the notes CAC score 64th percentile adjusted for age, gender. Would recommend increasing statin if she tolerates it.  Mild ascending aorta dilation. Consider echo. Please call the patient and see if she is agreeable to changin her statin to 1.5 tablets and if she would like the echocardiogram I mentioned in her message. The patient would like to get more information before she can make a discusin

## 2023-06-19 MED ORDER — ATORVASTATIN CALCIUM 20 MG PO TABS: 30.0000 mg | ORAL_TABLET | Freq: Every day | ORAL | 3 refills | Status: DC

## 2023-06-19 NOTE — Addendum Note (Signed)
 Addended by: Laneta Pintos on: 06/19/2023 06:42 AM   Modules accepted: Orders

## 2023-07-17 ENCOUNTER — Other Ambulatory Visit: Payer: Self-pay | Admitting: Family Medicine

## 2023-07-27 ENCOUNTER — Ambulatory Visit: Attending: Family Medicine

## 2023-07-27 DIAGNOSIS — I361 Nonrheumatic tricuspid (valve) insufficiency: Secondary | ICD-10-CM

## 2023-07-27 DIAGNOSIS — I77810 Thoracic aortic ectasia: Secondary | ICD-10-CM

## 2023-07-27 LAB — ECHOCARDIOGRAM COMPLETE
Area-P 1/2: 4.96 cm2
MV M vel: 3.87 m/s
MV Peak grad: 59.9 mmHg
S' Lateral: 2.5 cm

## 2023-10-15 DIAGNOSIS — Z01419 Encounter for gynecological examination (general) (routine) without abnormal findings: Secondary | ICD-10-CM | POA: Diagnosis not present

## 2023-10-15 DIAGNOSIS — Z6837 Body mass index (BMI) 37.0-37.9, adult: Secondary | ICD-10-CM | POA: Diagnosis not present

## 2023-10-15 DIAGNOSIS — K219 Gastro-esophageal reflux disease without esophagitis: Secondary | ICD-10-CM | POA: Diagnosis not present

## 2023-10-15 DIAGNOSIS — Z1231 Encounter for screening mammogram for malignant neoplasm of breast: Secondary | ICD-10-CM | POA: Diagnosis not present

## 2023-10-15 DIAGNOSIS — N958 Other specified menopausal and perimenopausal disorders: Secondary | ICD-10-CM | POA: Diagnosis not present

## 2023-10-15 DIAGNOSIS — Z13820 Encounter for screening for osteoporosis: Secondary | ICD-10-CM | POA: Diagnosis not present

## 2023-10-30 ENCOUNTER — Other Ambulatory Visit: Payer: Self-pay | Admitting: *Deleted

## 2023-10-30 DIAGNOSIS — E782 Mixed hyperlipidemia: Secondary | ICD-10-CM

## 2023-10-30 DIAGNOSIS — E559 Vitamin D deficiency, unspecified: Secondary | ICD-10-CM

## 2023-10-30 DIAGNOSIS — R7301 Impaired fasting glucose: Secondary | ICD-10-CM

## 2023-11-02 ENCOUNTER — Other Ambulatory Visit

## 2023-11-02 DIAGNOSIS — Z6835 Body mass index (BMI) 35.0-35.9, adult: Secondary | ICD-10-CM | POA: Diagnosis not present

## 2023-11-02 DIAGNOSIS — E782 Mixed hyperlipidemia: Secondary | ICD-10-CM

## 2023-11-02 DIAGNOSIS — E559 Vitamin D deficiency, unspecified: Secondary | ICD-10-CM | POA: Diagnosis not present

## 2023-11-02 DIAGNOSIS — R7301 Impaired fasting glucose: Secondary | ICD-10-CM | POA: Diagnosis not present

## 2023-11-03 LAB — COMPREHENSIVE METABOLIC PANEL WITH GFR
ALT: 25 IU/L (ref 0–32)
AST: 20 IU/L (ref 0–40)
Albumin: 4.4 g/dL (ref 3.9–4.9)
Alkaline Phosphatase: 84 IU/L (ref 49–135)
BUN/Creatinine Ratio: 17 (ref 12–28)
BUN: 13 mg/dL (ref 8–27)
Bilirubin Total: 0.5 mg/dL (ref 0.0–1.2)
CO2: 24 mmol/L (ref 20–29)
Calcium: 9.4 mg/dL (ref 8.7–10.3)
Chloride: 101 mmol/L (ref 96–106)
Creatinine, Ser: 0.77 mg/dL (ref 0.57–1.00)
Globulin, Total: 2.3 g/dL (ref 1.5–4.5)
Glucose: 100 mg/dL — ABNORMAL HIGH (ref 70–99)
Potassium: 4.2 mmol/L (ref 3.5–5.2)
Sodium: 140 mmol/L (ref 134–144)
Total Protein: 6.7 g/dL (ref 6.0–8.5)
eGFR: 85 mL/min/1.73 (ref >59)

## 2023-11-03 LAB — LIPID PANEL
Chol/HDL Ratio: 4.7 ratio — ABNORMAL HIGH (ref 0.0–4.4)
Cholesterol, Total: 208 mg/dL — ABNORMAL HIGH (ref 100–199)
HDL: 44 mg/dL (ref >39)
LDL Chol Calc (NIH): 122 mg/dL — ABNORMAL HIGH (ref 0–99)
Triglycerides: 238 mg/dL — ABNORMAL HIGH (ref 0–149)
VLDL Cholesterol Cal: 42 mg/dL — ABNORMAL HIGH (ref 5–40)

## 2023-11-03 LAB — HEMOGLOBIN A1C
Est. average glucose Bld gHb Est-mCnc: 123 mg/dL
Hgb A1c MFr Bld: 5.9 % — ABNORMAL HIGH (ref 4.8–5.6)

## 2023-11-03 LAB — VITAMIN D 25 HYDROXY (VIT D DEFICIENCY, FRACTURES): Vit D, 25-Hydroxy: 41.7 ng/mL (ref 30.0–100.0)

## 2023-11-05 ENCOUNTER — Ambulatory Visit: Payer: Self-pay | Admitting: Family Medicine

## 2023-11-09 ENCOUNTER — Ambulatory Visit (INDEPENDENT_AMBULATORY_CARE_PROVIDER_SITE_OTHER): Admitting: Family Medicine

## 2023-11-09 ENCOUNTER — Encounter: Payer: Self-pay | Admitting: Family Medicine

## 2023-11-09 VITALS — BP 133/79 | HR 63 | Ht 63.0 in | Wt 200.0 lb

## 2023-11-09 DIAGNOSIS — E782 Mixed hyperlipidemia: Secondary | ICD-10-CM

## 2023-11-09 DIAGNOSIS — Z0000 Encounter for general adult medical examination without abnormal findings: Secondary | ICD-10-CM

## 2023-11-09 DIAGNOSIS — I77810 Thoracic aortic ectasia: Secondary | ICD-10-CM | POA: Diagnosis not present

## 2023-11-09 DIAGNOSIS — Z6835 Body mass index (BMI) 35.0-35.9, adult: Secondary | ICD-10-CM

## 2023-11-09 DIAGNOSIS — M25551 Pain in right hip: Secondary | ICD-10-CM

## 2023-11-09 DIAGNOSIS — H6123 Impacted cerumen, bilateral: Secondary | ICD-10-CM

## 2023-11-09 NOTE — Assessment & Plan Note (Signed)
 Bilateral cerumen impaction, causing symptoms of fullness and itching. Patient has a history of vertigo with ear lavage. - Partial removal of wax from right ear with curette in office. - Advised against using Q-tips or other objects in the ear canal. - Patient will continue using over-the-counter Debrox drops to soften wax.

## 2023-11-09 NOTE — Assessment & Plan Note (Signed)
 Up to date on preventive care. - Mammogram: Normal (10/15/2023). - Bone Density Scan: Performed 10/15/2023. Will obtain records. - Colonoscopy: Recent, up to date. - Flu vaccine: Received 10/17/2023 at [facility]. - Will bill today's visit as annual wellness exam.

## 2023-11-09 NOTE — Assessment & Plan Note (Signed)
 Mild ascending aortic dilation (4 cm) noted incidentally on coronary artery calcium  score CT scan from 05/2023. Subsequent echocardiogram showed normal aortic root size. - For safety, will go by CT finding. - Plan to repeat CT scan in approximately one year (around 05/2024).

## 2023-11-09 NOTE — Assessment & Plan Note (Signed)
 History of elevated cholesterol, currently on atorvastatin  20mg . Fasting lipids show elevated triglycerides and LDL not at goal. Coronary artery calcium  score from 05/2023 was 17 in the RCA, placing her in the 64th percentile for age/gender. Goal LDL is <100. Patient is reluctant to increase statin dose or switch statins. - Discussed adding ezetimibe (Zetia) to current regimen to help lower LDL. - Patient will consider starting ezetimibe. - Continue atorvastatin  20mg  daily. - Continue CoQ10. - Reinforce lifestyle modifications including regular exercise and limiting saturated fats. - Follow up in 1 year for annual wellness visit. Can follow up in 6 months to discuss cholesterol if desired.

## 2023-11-09 NOTE — Progress Notes (Signed)
 Established Patient Office Visit  Subjective   Patient ID: Veronica Leonard, female    DOB: [birth year]  Age: 68 y.o. MRN: [id number]  Chief Complaint  Patient presents with   Medical Management of Chronic Issues    HPI  Subjective - Hyperlipidemia follow-up. Discussed recent labs and imaging. - New onset hip/groin pain. - Cerumen impaction.  -  New onset left hip/groin pain, described as a feeling it wants to pop out. No associated numbness or tingling. Denies typical statin-related myalgias in large muscles. Previously had calf tightness which resolved with CoQ10. -  Sensation of fullness and itchiness in both ears, worse on the right. Reports history of vertigo after ear lavage. Denies current hearing loss, but has had previous episodes of hearing loss from impaction.  -  History of bloody noses, believed to be from dryness. Nasal saline spray is helpful. Experiences sinus pressure and post-nasal drip.  Medications: Atorvastatin  20 mg daily, CoQ10, magnesium (rotates types), vitamin D , azelastine 0.5 tab PRN allergies, Zyrtec PRN severe allergies.  PMH, PSH, FH, Social Hx: PMHx: Hyperlipidemia (diagnosed post-menopause), allergies, deviated septum (80%), mild hiatal hernia on CT. FHx: Sister (age 39) with stents placed ~4 years ago. Another sister (heavy smoker) with one stent. Unclear history of other heart problems. Social Hx: Does not smoke. Cares for 14-month-old grandson.    The 10-year ASCVD risk score (Arnett DK, et al., 2019) is: 7.4%  Health Maintenance Due  Topic Date Due   DEXA SCAN  Never done   COVID-19 Vaccine (6 - 2025-26 season) 09/17/2023   Medicare Annual Wellness (AWV)  11/23/2023      Objective:     BP 133/79   Pulse 63   Ht 5' 3 (1.6 m)   Wt 200 lb (90.7 kg)   LMP 09/14/2012   SpO2 100%   BMI 35.43 kg/m    Physical Exam Gen: alert, oriented HEENT: perrla, eomi, mmm. Both ear canals contain significant, soft cerumen, partially  occluding view of tympanic membranes. Partial removal of wax from right ear performed with curette. No erythema or discharge. CV: rrr, no murmur Pulm: lctab. No wheeze or crackles.  GI: soft, nbs.  Nontender to palpation MSK: Hip: No pain on passive flexion, internal/external rotation of the hip. No pain on FABER test. No pain with resisted flexion. No back pain on exam. Ext: no pedal edema Skin: warm and dry, no rashes Psych: pleasant affect.  Spontaneous speech  No results found for any visits on 11/09/23.      Assessment & Plan:   Physical exam, annual  Mixed hyperlipidemia Assessment & Plan: History of elevated cholesterol, currently on atorvastatin  20mg . Fasting lipids show elevated triglycerides and LDL not at goal. Coronary artery calcium  score from 05/2023 was 17 in the RCA, placing her in the 64th percentile for age/gender. Goal LDL is <100. Patient is reluctant to increase statin dose or switch statins. - Discussed adding ezetimibe (Zetia) to current regimen to help lower LDL. - Patient will consider starting ezetimibe. - Continue atorvastatin  20mg  daily. - Continue CoQ10. - Reinforce lifestyle modifications including regular exercise and limiting saturated fats. - Follow up in 1 year for annual wellness visit. Can follow up in 6 months to discuss cholesterol if desired.   Aortic root dilation Assessment & Plan: Mild ascending aortic dilation (4 cm) noted incidentally on coronary artery calcium  score CT scan from 05/2023. Subsequent echocardiogram showed normal aortic root size. - For safety, will go by CT finding. - Plan to  repeat CT scan in approximately one year (around 05/2024).   Right hip pain Assessment & Plan: New onset pain in the left hip/groin area. Exam does not elicit pain and is largely unremarkable. Does not appear to be a statin side effect. Differential includes musculoskeletal strain or intra-articular pathology. - Reassured that this is unlikely  related to medication side effects. - Discussed possibility of an X-ray if symptoms persist, noting it would primarily show bony changes like arthritis.   Bilateral impacted cerumen Assessment & Plan: Bilateral cerumen impaction, causing symptoms of fullness and itching. Patient has a history of vertigo with ear lavage. - Partial removal of wax from right ear with curette in office. - Advised against using Q-tips or other objects in the ear canal. - Patient will continue using over-the-counter Debrox drops to soften wax.   Healthcare maintenance Assessment & Plan: Up to date on preventive care. - Mammogram: Normal (10/15/2023). - Bone Density Scan: Performed 10/15/2023. Will obtain records. - Colonoscopy: Recent, up to date. - Flu vaccine: Received 10/17/2023 at [facility]. - Will bill today's visit as annual wellness exam.      Return in about 1 year (around 11/08/2024) for physical.    Toribio MARLA Slain, MD

## 2023-11-09 NOTE — Assessment & Plan Note (Signed)
 New onset pain in the left hip/groin area. Exam does not elicit pain and is largely unremarkable. Does not appear to be a statin side effect. Differential includes musculoskeletal strain or intra-articular pathology. - Reassured that this is unlikely related to medication side effects. - Discussed possibility of an X-ray if symptoms persist, noting it would primarily show bony changes like arthritis.

## 2023-11-09 NOTE — Patient Instructions (Signed)
 It was nice to see you today,  We addressed the following topics today: - You can use over-the-counter Debrox drops for your ear wax. Do not push things like Q-tips into your ears. - When using Flonase for your sinuses, spray it towards the outer part of your nostril (e.g., in the left nostril, aim toward your left ear) and avoid sniffing it back forcefully. - I would like you to consider starting ezetimibe (Zetia) for your cholesterol. You can take some time to think about it. - Continue your lifestyle efforts with diet and exercise. - We will plan to have you come back in one year for your next annual physical.  Have a great day,  Rolan Slain, MD

## 2023-11-14 DIAGNOSIS — E785 Hyperlipidemia, unspecified: Secondary | ICD-10-CM | POA: Diagnosis not present

## 2023-11-14 DIAGNOSIS — R030 Elevated blood-pressure reading, without diagnosis of hypertension: Secondary | ICD-10-CM | POA: Diagnosis not present

## 2023-11-14 DIAGNOSIS — I471 Supraventricular tachycardia, unspecified: Secondary | ICD-10-CM | POA: Diagnosis not present

## 2023-11-14 DIAGNOSIS — N959 Unspecified menopausal and perimenopausal disorder: Secondary | ICD-10-CM | POA: Diagnosis not present

## 2023-11-14 DIAGNOSIS — Z6835 Body mass index (BMI) 35.0-35.9, adult: Secondary | ICD-10-CM | POA: Diagnosis not present

## 2023-11-14 DIAGNOSIS — I369 Nonrheumatic tricuspid valve disorder, unspecified: Secondary | ICD-10-CM | POA: Diagnosis not present

## 2023-11-14 DIAGNOSIS — K219 Gastro-esophageal reflux disease without esophagitis: Secondary | ICD-10-CM | POA: Diagnosis not present

## 2024-01-02 ENCOUNTER — Other Ambulatory Visit: Payer: Self-pay | Admitting: Family Medicine

## 2024-01-02 DIAGNOSIS — E782 Mixed hyperlipidemia: Secondary | ICD-10-CM

## 2024-11-03 ENCOUNTER — Other Ambulatory Visit

## 2024-11-10 ENCOUNTER — Encounter: Admitting: Family Medicine
